# Patient Record
Sex: Male | Born: 1950 | Race: White | Hispanic: No | Marital: Married
Health system: Midwestern US, Academic
[De-identification: ages and names within clinical notes are randomized; demographics above are authoritative.]

---

## 2017-07-30 LAB — COMPREHENSIVE METABOLIC PANEL
Lab: 0.4
Lab: 1
Lab: 104
Lab: 107 — ABNORMAL HIGH (ref 65–99)
Lab: 139 — ABNORMAL HIGH (ref <150)
Lab: 18
Lab: 27
Lab: 3.9
Lab: 34
Lab: 48 — ABNORMAL HIGH (ref 9–46)
Lab: 6.7
Lab: 70
Lab: 76
Lab: 81
Lab: 9.2

## 2017-07-30 LAB — PROSTATIC SPECIFIC ANTIGEN-PSA: Lab: 1.3 — ABNORMAL LOW (ref >40)

## 2017-07-30 LAB — LIPID PROFILE: Lab: 102

## 2017-09-22 ENCOUNTER — Encounter: Admit: 2017-09-22 | Discharge: 2017-09-22 | Payer: MEDICARE

## 2017-09-22 NOTE — Telephone Encounter
Checked with pt. He has not attempted to send a picture yet. It might be later tonight. Offered him a possible appt time 12/6 if needed. He might be in the area on Monday and will stop by then if needed. Will have picture reviewed when it arrives and advise pt.

## 2017-09-22 NOTE — Telephone Encounter
Pt called to report a reddened, spot about 1 1/2" to the right of sternal incision. Feels like tip of a pacer wire is sticking out; another wire was pulled out in that same area. It began about three months ago and now is a raised, reddened area. Asked pt to send picture to [facility] Email for evaluation.

## 2017-09-23 ENCOUNTER — Ambulatory Visit: Admit: 2017-09-23 | Discharge: 2017-09-24 | Payer: 59

## 2017-09-24 DIAGNOSIS — Z950 Presence of cardiac pacemaker: Principal | ICD-10-CM

## 2017-12-17 ENCOUNTER — Encounter: Admit: 2017-12-17 | Discharge: 2017-12-17 | Payer: MEDICARE

## 2018-01-20 ENCOUNTER — Encounter: Admit: 2018-01-20 | Discharge: 2018-01-20 | Payer: MEDICARE

## 2018-01-20 DIAGNOSIS — R9439 Abnormal result of other cardiovascular function study: ICD-10-CM

## 2018-01-20 DIAGNOSIS — E039 Hypothyroidism, unspecified: ICD-10-CM

## 2018-01-20 DIAGNOSIS — E669 Obesity, unspecified: ICD-10-CM

## 2018-01-20 DIAGNOSIS — K219 Gastro-esophageal reflux disease without esophagitis: ICD-10-CM

## 2018-01-20 DIAGNOSIS — E785 Hyperlipidemia, unspecified: Principal | ICD-10-CM

## 2018-01-20 DIAGNOSIS — M109 Gout, unspecified: ICD-10-CM

## 2018-01-20 DIAGNOSIS — R079 Chest pain, unspecified: ICD-10-CM

## 2018-01-20 DIAGNOSIS — I2510 Atherosclerotic heart disease of native coronary artery without angina pectoris: ICD-10-CM

## 2018-03-15 ENCOUNTER — Ambulatory Visit: Admit: 2018-03-15 | Discharge: 2018-03-16 | Payer: 59

## 2018-03-15 ENCOUNTER — Encounter: Admit: 2018-03-15 | Discharge: 2018-03-15 | Payer: MEDICARE

## 2018-03-15 DIAGNOSIS — I10 Essential (primary) hypertension: Principal | ICD-10-CM

## 2018-03-15 DIAGNOSIS — R9439 Abnormal result of other cardiovascular function study: ICD-10-CM

## 2018-03-15 DIAGNOSIS — I2510 Atherosclerotic heart disease of native coronary artery without angina pectoris: ICD-10-CM

## 2018-03-15 DIAGNOSIS — M109 Gout, unspecified: ICD-10-CM

## 2018-03-15 DIAGNOSIS — E782 Mixed hyperlipidemia: ICD-10-CM

## 2018-03-15 DIAGNOSIS — K219 Gastro-esophageal reflux disease without esophagitis: ICD-10-CM

## 2018-03-15 DIAGNOSIS — E669 Obesity, unspecified: ICD-10-CM

## 2018-03-15 DIAGNOSIS — Z951 Presence of aortocoronary bypass graft: ICD-10-CM

## 2018-03-15 DIAGNOSIS — E785 Hyperlipidemia, unspecified: Principal | ICD-10-CM

## 2018-03-15 DIAGNOSIS — R079 Chest pain, unspecified: ICD-10-CM

## 2018-03-15 DIAGNOSIS — E039 Hypothyroidism, unspecified: ICD-10-CM

## 2018-03-17 ENCOUNTER — Encounter: Admit: 2018-03-17 | Discharge: 2018-03-17 | Payer: MEDICARE

## 2018-05-20 ENCOUNTER — Encounter: Admit: 2018-05-20 | Discharge: 2018-05-20 | Payer: MEDICARE

## 2018-05-24 ENCOUNTER — Ambulatory Visit: Admit: 2018-05-24 | Discharge: 2018-05-25 | Payer: 59

## 2018-05-24 ENCOUNTER — Encounter: Admit: 2018-05-24 | Discharge: 2018-05-24 | Payer: MEDICARE

## 2018-05-24 DIAGNOSIS — K219 Gastro-esophageal reflux disease without esophagitis: ICD-10-CM

## 2018-05-24 DIAGNOSIS — E785 Hyperlipidemia, unspecified: Principal | ICD-10-CM

## 2018-05-24 DIAGNOSIS — E669 Obesity, unspecified: ICD-10-CM

## 2018-05-24 DIAGNOSIS — E782 Mixed hyperlipidemia: ICD-10-CM

## 2018-05-24 DIAGNOSIS — M109 Gout, unspecified: ICD-10-CM

## 2018-05-24 DIAGNOSIS — I2510 Atherosclerotic heart disease of native coronary artery without angina pectoris: Principal | ICD-10-CM

## 2018-05-24 DIAGNOSIS — E039 Hypothyroidism, unspecified: ICD-10-CM

## 2018-05-24 DIAGNOSIS — R079 Chest pain, unspecified: ICD-10-CM

## 2018-05-24 DIAGNOSIS — R9439 Abnormal result of other cardiovascular function study: ICD-10-CM

## 2018-05-24 DIAGNOSIS — I10 Essential (primary) hypertension: Secondary | ICD-10-CM

## 2018-05-24 DIAGNOSIS — Z951 Presence of aortocoronary bypass graft: ICD-10-CM

## 2018-05-25 ENCOUNTER — Encounter: Admit: 2018-05-25 | Discharge: 2018-05-25 | Payer: MEDICARE

## 2018-05-25 DIAGNOSIS — I208 Other forms of angina pectoris: Principal | ICD-10-CM

## 2018-05-25 DIAGNOSIS — I25118 Atherosclerotic heart disease of native coronary artery with other forms of angina pectoris: ICD-10-CM

## 2018-05-25 DIAGNOSIS — R9439 Abnormal result of other cardiovascular function study: ICD-10-CM

## 2018-05-25 DIAGNOSIS — I10 Essential (primary) hypertension: ICD-10-CM

## 2018-05-25 DIAGNOSIS — Z8249 Family history of ischemic heart disease and other diseases of the circulatory system: ICD-10-CM

## 2018-05-25 MED ORDER — LOSARTAN 100 MG PO TAB
100 mg | ORAL_TABLET | Freq: Every day | ORAL | 0 refills | 30.00000 days | Status: AC
Start: 2018-05-25 — End: 2018-08-19

## 2018-08-19 ENCOUNTER — Encounter: Admit: 2018-08-19 | Discharge: 2018-08-19 | Payer: MEDICARE

## 2018-08-19 DIAGNOSIS — I2510 Atherosclerotic heart disease of native coronary artery without angina pectoris: Principal | ICD-10-CM

## 2018-08-19 MED ORDER — LOSARTAN 100 MG PO TAB
ORAL_TABLET | Freq: Every day | ORAL | 0 refills | 30.00000 days | Status: AC
Start: 2018-08-19 — End: 2018-11-11

## 2018-09-03 LAB — THYROID STIMULATING HORMONE-TSH: Lab: 2.4

## 2018-09-03 LAB — BASIC METABOLIC PANEL: Lab: 137

## 2018-09-09 ENCOUNTER — Encounter: Admit: 2018-09-09 | Discharge: 2018-09-09 | Payer: MEDICARE

## 2018-10-06 ENCOUNTER — Encounter: Admit: 2018-10-06 | Discharge: 2018-10-06 | Payer: MEDICARE

## 2018-10-06 ENCOUNTER — Ambulatory Visit: Admit: 2018-10-06 | Discharge: 2018-10-06 | Payer: 59

## 2018-10-06 DIAGNOSIS — E669 Obesity, unspecified: ICD-10-CM

## 2018-10-06 DIAGNOSIS — R079 Chest pain, unspecified: ICD-10-CM

## 2018-10-06 DIAGNOSIS — R9439 Abnormal result of other cardiovascular function study: ICD-10-CM

## 2018-10-06 DIAGNOSIS — K219 Gastro-esophageal reflux disease without esophagitis: ICD-10-CM

## 2018-10-06 DIAGNOSIS — I2510 Atherosclerotic heart disease of native coronary artery without angina pectoris: ICD-10-CM

## 2018-10-06 DIAGNOSIS — E785 Hyperlipidemia, unspecified: Principal | ICD-10-CM

## 2018-10-06 DIAGNOSIS — E782 Mixed hyperlipidemia: Principal | ICD-10-CM

## 2018-10-06 DIAGNOSIS — M109 Gout, unspecified: ICD-10-CM

## 2018-10-06 DIAGNOSIS — E039 Hypothyroidism, unspecified: ICD-10-CM

## 2018-10-06 MED ORDER — AMLODIPINE 5 MG PO TAB
5 mg | ORAL_TABLET | Freq: Every day | ORAL | 6 refills | Status: AC
Start: 2018-10-06 — End: 2018-10-06

## 2018-10-06 MED ORDER — AMLODIPINE 5 MG PO TAB
5 mg | ORAL_TABLET | Freq: Every day | ORAL | 1 refills | Status: AC
Start: 2018-10-06 — End: 2019-03-28

## 2018-10-10 ENCOUNTER — Ambulatory Visit: Admit: 2018-10-10 | Discharge: 2018-10-11 | Payer: 59

## 2018-10-10 DIAGNOSIS — E782 Mixed hyperlipidemia: Principal | ICD-10-CM

## 2018-10-10 DIAGNOSIS — I2510 Atherosclerotic heart disease of native coronary artery without angina pectoris: ICD-10-CM

## 2018-10-10 MED ORDER — NITROGLYCERIN 0.4 MG SL SUBL
.4 mg | SUBLINGUAL | 0 refills | Status: AC | PRN
Start: 2018-10-10 — End: ?

## 2018-10-10 MED ORDER — AMINOPHYLLINE 500 MG/20 ML IV SOLN
50 mg | INTRAVENOUS | 0 refills | Status: AC | PRN
Start: 2018-10-10 — End: ?

## 2018-10-10 MED ORDER — SODIUM CHLORIDE 0.9 % IV SOLP
250 mL | INTRAVENOUS | 0 refills | Status: AC | PRN
Start: 2018-10-10 — End: ?

## 2018-10-10 MED ORDER — BENZOCAINE-MENTHOL 6-10 MG MM LOZG
1 | Freq: Once | ORAL | 0 refills | Status: AC | PRN
Start: 2018-10-10 — End: ?

## 2018-10-10 MED ORDER — ALBUTEROL SULFATE 90 MCG/ACTUATION IN HFAA
2 | RESPIRATORY_TRACT | 0 refills | Status: DC | PRN
Start: 2018-10-10 — End: 2018-10-15

## 2018-10-10 MED ORDER — REGADENOSON 0.4 MG/5 ML IV SYRG
.4 mg | Freq: Once | INTRAVENOUS | 0 refills | Status: CP
Start: 2018-10-10 — End: ?

## 2018-10-13 ENCOUNTER — Encounter: Admit: 2018-10-13 | Discharge: 2018-10-13 | Payer: MEDICARE

## 2018-10-14 ENCOUNTER — Encounter: Admit: 2018-10-14 | Discharge: 2018-10-14 | Payer: MEDICARE

## 2018-11-11 ENCOUNTER — Encounter: Admit: 2018-11-11 | Discharge: 2018-11-11 | Payer: MEDICARE

## 2018-11-11 MED ORDER — LOSARTAN 100 MG PO TAB
ORAL_TABLET | Freq: Every day | ORAL | 3 refills | 30.00000 days | Status: AC
Start: 2018-11-11 — End: 2019-02-21

## 2018-11-11 MED ORDER — LOSARTAN 100 MG PO TAB
100 mg | ORAL_TABLET | Freq: Every day | ORAL | 3 refills | 90.00000 days | Status: AC
Start: 2018-11-11 — End: 2019-02-21

## 2018-11-11 MED ORDER — LOSARTAN 100 MG PO TAB
100 mg | ORAL_TABLET | Freq: Every day | ORAL | 3 refills | 30.00000 days | Status: AC
Start: 2018-11-11 — End: 2018-11-11

## 2019-02-20 ENCOUNTER — Encounter: Admit: 2019-02-20 | Discharge: 2019-02-20 | Payer: MEDICARE

## 2019-02-21 MED ORDER — LOSARTAN 100 MG PO TAB
100 mg | ORAL_TABLET | Freq: Every day | ORAL | 1 refills | 30.00000 days | Status: DC
Start: 2019-02-21 — End: 2019-02-23

## 2019-02-21 MED ORDER — VALSARTAN 320 MG PO TAB
ORAL_TABLET | 0 refills
Start: 2019-02-21 — End: ?

## 2019-02-22 ENCOUNTER — Encounter: Admit: 2019-02-22 | Discharge: 2019-02-22 | Payer: MEDICARE

## 2019-02-23 MED ORDER — LOSARTAN 100 MG PO TAB
ORAL_TABLET | Freq: Every day | ORAL | 3 refills | 90.00000 days | Status: DC
Start: 2019-02-23 — End: 2020-04-02

## 2019-03-28 ENCOUNTER — Ambulatory Visit: Admit: 2019-03-28 | Discharge: 2019-03-29

## 2019-03-28 ENCOUNTER — Encounter: Admit: 2019-03-28 | Discharge: 2019-03-28

## 2019-03-28 DIAGNOSIS — M109 Gout, unspecified: Secondary | ICD-10-CM

## 2019-03-28 DIAGNOSIS — I10 Essential (primary) hypertension: Secondary | ICD-10-CM

## 2019-03-28 DIAGNOSIS — K219 Gastro-esophageal reflux disease without esophagitis: Secondary | ICD-10-CM

## 2019-03-28 DIAGNOSIS — E039 Hypothyroidism, unspecified: Secondary | ICD-10-CM

## 2019-03-28 DIAGNOSIS — E785 Hyperlipidemia, unspecified: Secondary | ICD-10-CM

## 2019-03-28 DIAGNOSIS — I2510 Atherosclerotic heart disease of native coronary artery without angina pectoris: Secondary | ICD-10-CM

## 2019-03-28 DIAGNOSIS — E669 Obesity, unspecified: Secondary | ICD-10-CM

## 2019-03-28 DIAGNOSIS — E7800 Pure hypercholesterolemia, unspecified: Secondary | ICD-10-CM

## 2019-03-28 DIAGNOSIS — R9439 Abnormal result of other cardiovascular function study: Secondary | ICD-10-CM

## 2019-03-28 DIAGNOSIS — R079 Chest pain, unspecified: Secondary | ICD-10-CM

## 2019-03-28 MED ORDER — AMLODIPINE 5 MG PO TAB
5 mg | ORAL_TABLET | ORAL | 3 refills | Status: DC
Start: 2019-03-28 — End: 2019-12-07

## 2019-03-28 MED ORDER — AMLODIPINE 2.5 MG PO TAB
2.5 mg | ORAL_TABLET | Freq: Every evening | ORAL | 3 refills | Status: CN
Start: 2019-03-28 — End: ?

## 2019-03-28 NOTE — Progress Notes
Date of Service: 03/28/2019    Billy May is a 68 y.o. male.       HPI     Billy May is followed for coronary artery disease, hypertension and hypercholesterolemia.  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  On November 27, 2016 he underwent coronary artery bypass times 4 (left internal mammary artery anastomosed to the LAD, reverse saphenous vein graft anastomosed in sequence to the high diagonal and circumflex marginal artery and reverse saphenous vein graft anastomosed to the posterior descending artery).  He believes that his blood pressure tends to run approximately 140/80 mmHg.  Otherwise, The patient has been doing well and reports no angina, congestive symptoms, palpitations, sensation of sustained forceful heart pounding, lightheadedness or syncope.  His exercise tolerance has been stable.  He is active with farming but does not have a regular exercise program.  The patient reports no myalgias, bleeding abnormalities, neurologic motor abnormalities or difficulty with speech.        Vitals:    03/28/19 0833 03/28/19 0857   BP: (!) 140/84 134/78   BP Source: Arm, Left Upper Arm, Right Upper   Pulse: 56    SpO2: 98%    Weight: 91.2 kg (201 lb)    Height: 1.702 m (5' 7)    PainSc: Zero      Body mass index is 31.48 kg/m???.     Past Medical History  Patient Active Problem List    Diagnosis Date Noted   ??? 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 Acute blood loss anemia 11/27/2016   ??? Chronic diastolic CHF (congestive heart failure), NYHA class 2 (HCC) 11/26/2016   ??? Abnormal cardiovascular stress test    ??? Obesity, Class I, BMI 30-34.9    ??? GERD (gastroesophageal reflux disease)    ??? Coronary artery disease involving native coronary artery of native heart without angina pectoris 11/24/2016     Added automatically from request for surgery [id number]     ??? CAD (coronary artery disease) 11/19/2016     11/19/16 Cardiac Catheterization by Dr. Steward Ros  ANGIOGRAPHY: 1. The left main arises normally from the left coronary cusp and bifurcates into the LAD and the left circumflex artery.  The left main has about a 30% stenosis in its distal segment.    2. The LAD arises normally from the left main coronary artery.  The LAD is a type 3 vessel.  The LAD has diffuse 40% to 50% disease in its proximal, as well as in its mid section, and it is heavily calcified in the same area.  The LAD also has an 80% stenosis in its mid segment.  Distal to this, the LAD has about 20% to 30% stenosis in the mid section after the second diagonal branch.  The LAD gives off 3 diagonal branches.  The first of the 3 diagonal branches has about 50% to 60% stenosis in its proximal segment.     3. The circumflex artery arises normally from the left main coronary artery.  The left circumflex artery has 4-5 tandem lesions in its proximal to mid section, which are all varying from 70% to 90% stenosis.  It gives off an OM branch that does not appear to have any angiographic evidence of significant stenosis.  4. The RCA is a dominant vessel that arises normally from the right coronary cusp.  The RCA has about 50% stenosis in its ostial segment.  The mid  to distal RCA is heavily calcified with diffuse disease of about 30% to 40%.  The PDA branch appears to have about 90% stenosis in its to distal segment.  The PLV branch is a small branch that does not appear to have any angiographic evidence of stenosis.  CONCLUSION:  Multivessel coronary artery disease as described above.     ??? Abnormal thallium stress test 11/05/2016   ??? Essential hypertension 09/22/2016   ??? Family history of coronary artery disease 09/22/2016   ??? Hyperlipemia 09/08/2016   ??? Observation for suspected cardiovascular disease 09/08/2016     01/28/2014  Exercise Stress exam   Conclusion:  No exercises induced chest pain, no ECG evidence of ischemia. PVC seen.  Hypertensive response to exercise  Medical thearpy with lifestyle modification. Low risk study.     ??? Chest pain 09/08/2016         Review of Systems   Constitution: Negative.   HENT: Positive for tinnitus.    Eyes: Negative.    Cardiovascular: Negative.    Respiratory: Negative.    Endocrine: Negative.    Hematologic/Lymphatic: Negative.    Skin: Negative.    Musculoskeletal: Positive for back pain, gout and joint pain.   Gastrointestinal: Negative.    Genitourinary: Negative.    Neurological: Negative.    Psychiatric/Behavioral: Negative.    Allergic/Immunologic: Negative.        Physical Exam   GENERAL: The patient is well developed, well nourished, resting comfortably and in no distress.   HEENT: No abnormalities of the visible oro-nasopharynx, conjunctiva or sclera are noted.  NECK: There is no jugular venous distension. Carotids are palpable and without bruits. There is no thyroid enlargement.  Chest: Lung fields are clear to auscultation. There are no wheezes or crackles.  His sternal incision is well-healed without any evidence for infection.  CV: There is a regular rhythm. The first and second heart sounds are normal. There are no murmurs, gallops or rubs.  His apical heart rate is 60 bpm.  ABD: The abdomen is soft and supple with normal bowel sounds. There is no hepatosplenomegaly, ascites, tenderness, masses or bruits.  Neuro: There are no focal motor defects. Ambulation is normal. Cognitive function appears normal.  Ext: There is no edema or evidence of deep vein thrombosis. Peripheral pulses are satisfactory.    SKIN: There are no rashes and no cellulitis  PSYCH: The patient is calm, rationale and oriented.    Cardiovascular Studies  A twelve-lead ECG obtained on October 06, 2018 revealed normal sinus rhythm with a heart rate of 57 bpm.    Labs obtained on 09/02/2018 revealed serum potassium 4.7 mmol/L and serum creatinine 1.26 mg/dL.  His total cholesterol was 104, triglycerides 119, HDL 30 and LDL cholesterol 53 mg/dL.    Regadenoson thallium stress test 10/10/2018: Scintigraphic (planar/tomographic): TID Ratio:  1.12  (normal <1.36).  Summed Stress Score:  1   , Summed Rest Score:  0. Tomography shows viability in all myocardial segments with equivocal reversible changes in a small area of the inferolateral wall.  Quantitative polar maps show the same area in question is a small reversible inferolateral apical defect equivocal. Regional Wall Thickening and Motion Post Stress: Abnormal septal motion and inferolateral hypokinesis are present. Left Ventricular Ejection Fraction (post stress, in the resting state) =??? 53 %.  Left Ventricular End Diastolic Volume: 606 mL. SUMMARY/OPINION:?????? This study is minimally abnormal.  There is a subtle but statistically insignificant area of reversible ischemia in the inferolateral apex.  Global left ventricular function is within normal limits other high risk indicators are not noted. This exam is compared to the patient's previous myocardial perfusion study dated October 14, 2016. The previous study was performed on a Bruce protocol to 92% max heart rate without chest pain and with occasional PVCs and a Duke treadmill score of 8 with a calculated ejection fraction of 55% and an end-diastolic of 73 mL. The previous study was interpreted to be mildly abnormal in the inferolateral wall. ???  Comparing the 2 studies qualitatively the perfusion patterns are similar with mild ischemia located in the same inferolateral distribution.  The defect is slightly less intense on the current exam but it should be noted that this exam was performed with regadenoson pharmacological stress, the previous with maximal exercise Bruce treadmill testing.  Thus, the 2 studies may not be precisely comparable quantitatively. In aggregate the study is low risk in regards to predicted annual cardiovascular mortality rate.    Echo Doppler to 10/2016:  ??? Normal left ventricular systolic function, estimated ejection fraction is 65%. ??? Grade I (mild) left ventricular diastolic dysfunction.  ??? The right ventricle is normal size and function. Normal TAPSE, it ranged between 2.91 cm -- 3.01 cm.  ??? Sclerotic aortic valve without stenosis, no regurgitation.  ??? No other significant valvular abnormalities.  ??? The pulmonary artery pressure could not be obtained.    Problems Addressed Today  Coronary artery disease.  Hypertension.  Hypercholesterolemia.  Assessment and Plan     Treatment alternatives for hypertension were reviewed with the patient and he wanted to increase his amlodipine from 5 mg daily to 5 mg in the morning and 2.5 mg in the evening. I have asked the patient to keep a log book of his BP readings and to report systolic BP readings exceeding 130 mm Hg.  Alternatives for hypercholesterolemia were also reviewed with the patient but he did not want to change his current dose of atorvastatin at this time. Regular mild aerobic exercise, weight loss and adherence to a heart healthy diet were recommended. I have asked him to return for follow-up in 6 months.         Current Medications (including today's revisions)  ??? acetaminophen (TYLENOL) 500 mg tablet Take 500 mg by mouth as Needed for Pain. Max of 4,000 mg of acetaminophen in 24 hours.   ??? allopurinol (ZYLOPRIM) 300 mg tablet Take 300 mg by mouth daily. Take with food.   ??? amLODIPine (NORVASC) 5 mg tablet Take one tablet by mouth as directed. 1 tablet in the morning 1/2 tablet in the evening   ??? aspirin EC 81 mg tablet Take 81 mg by mouth at bedtime daily. Take with food.    ??? atorvastatin (LIPITOR) 20 mg tablet Take 1 tablet by mouth daily. (Patient taking differently: Take 20 mg by mouth at bedtime daily.)   ??? gabapentin (NEURONTIN) 300 mg capsule Take 300 mg by mouth daily.   ??? levothyroxine (SYNTHROID) 50 mcg tablet Take 50 mcg by mouth daily 30 minutes before breakfast.   ??? losartan (COZAAR) 100 mg tablet TAKE 1 TABLET BY MOUTH EVERY DAY ??? meloxicam (MOBIC) 15 mg tablet Take 15 mg by mouth daily.   ??? metoprolol tartrate (LOPRESSOR) 25 mg tablet Take 0.5 tablets by mouth twice daily.   ??? naproxen sodium (ALEVE) 220 mg capsule Take 1 tablet by mouth as Needed. Take with food.    ??? pantoprazole DR (PROTONIX) 40 mg tablet Take 40 mg by mouth  daily.

## 2019-12-05 ENCOUNTER — Encounter: Admit: 2019-12-05 | Discharge: 2019-12-05 | Payer: MEDICARE

## 2019-12-07 ENCOUNTER — Encounter: Admit: 2019-12-07 | Discharge: 2019-12-07 | Payer: 59

## 2019-12-07 DIAGNOSIS — E039 Hypothyroidism, unspecified: Secondary | ICD-10-CM

## 2019-12-07 DIAGNOSIS — R9439 Abnormal result of other cardiovascular function study: Secondary | ICD-10-CM

## 2019-12-07 DIAGNOSIS — M109 Gout, unspecified: Secondary | ICD-10-CM

## 2019-12-07 DIAGNOSIS — E669 Obesity, unspecified: Secondary | ICD-10-CM

## 2019-12-07 DIAGNOSIS — E785 Hyperlipidemia, unspecified: Secondary | ICD-10-CM

## 2019-12-07 DIAGNOSIS — I10 Essential (primary) hypertension: Secondary | ICD-10-CM

## 2019-12-07 DIAGNOSIS — I2510 Atherosclerotic heart disease of native coronary artery without angina pectoris: Secondary | ICD-10-CM

## 2019-12-07 DIAGNOSIS — R079 Chest pain, unspecified: Secondary | ICD-10-CM

## 2019-12-07 DIAGNOSIS — K219 Gastro-esophageal reflux disease without esophagitis: Secondary | ICD-10-CM

## 2019-12-07 MED ORDER — AMLODIPINE 5 MG PO TAB
5 mg | ORAL_TABLET | Freq: Two times a day (BID) | ORAL | 3 refills | Status: AC
Start: 2019-12-07 — End: ?

## 2020-04-02 ENCOUNTER — Encounter: Admit: 2020-04-02 | Discharge: 2020-04-02 | Payer: 59

## 2020-04-02 MED ORDER — LOSARTAN 100 MG PO TAB
ORAL_TABLET | Freq: Every day | ORAL | 2 refills | 30.00000 days | Status: AC
Start: 2020-04-02 — End: ?

## 2020-04-02 NOTE — Telephone Encounter
Received a request via computer from the patients pharmacy requesting a refill.  Script e-scribed as requested.

## 2020-07-18 ENCOUNTER — Encounter: Admit: 2020-07-18 | Discharge: 2020-07-18 | Payer: 59

## 2020-07-18 DIAGNOSIS — M109 Gout, unspecified: Secondary | ICD-10-CM

## 2020-07-18 DIAGNOSIS — R9439 Abnormal result of other cardiovascular function study: Secondary | ICD-10-CM

## 2020-07-18 DIAGNOSIS — I2510 Atherosclerotic heart disease of native coronary artery without angina pectoris: Secondary | ICD-10-CM

## 2020-07-18 DIAGNOSIS — E669 Obesity, unspecified: Secondary | ICD-10-CM

## 2020-07-18 DIAGNOSIS — E039 Hypothyroidism, unspecified: Secondary | ICD-10-CM

## 2020-07-18 DIAGNOSIS — E782 Mixed hyperlipidemia: Secondary | ICD-10-CM

## 2020-07-18 DIAGNOSIS — E785 Hyperlipidemia, unspecified: Secondary | ICD-10-CM

## 2020-07-18 DIAGNOSIS — R079 Chest pain, unspecified: Secondary | ICD-10-CM

## 2020-07-18 DIAGNOSIS — I10 Essential (primary) hypertension: Secondary | ICD-10-CM

## 2020-07-18 DIAGNOSIS — Z951 Presence of aortocoronary bypass graft: Secondary | ICD-10-CM

## 2020-07-18 DIAGNOSIS — K219 Gastro-esophageal reflux disease without esophagitis: Secondary | ICD-10-CM

## 2020-07-18 NOTE — Progress Notes
Date of Service: 07/18/2020    Billy May is a 69 y.o. male.       HPI     Mckinnley and his wife were in the [facility] today for follow-up regarding coronary disease.  In 2018 his younger brother had a heart attack and family members were advised to be screened for coronary disease.    Nasier had absolutely no symptoms but his stress test was markedly abnormal and coronary arteriography demonstrated surgical disease.    He cannot see that he feels any differently after the bypass operation.  He remains free of any exertional chest discomfort or breathlessness.  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    Aside from family history he has been treated for hypertension and dyslipidemia.  He does not have diabetes nor does he use tobacco.         Vitals:    07/18/20 1546   BP: 132/76   BP Source: Arm, Left Upper   Patient Position: Sitting   Pulse: 58   SpO2: 98%   Weight: 94.5 kg (208 lb 6.4 oz)   Height: 1.702 m (5' 7)   PainSc: Zero     Body mass index is 32.64 kg/m?Marland Kitchen     Past Medical History  Patient Active Problem List    Diagnosis Date Noted   ? 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 Acute blood loss anemia 11/27/2016   ? Chronic diastolic CHF (congestive heart failure), NYHA class 2 (HCC) 11/26/2016   ? Abnormal cardiovascular stress test    ? Obesity, Class I, BMI 30-34.9    ? GERD (gastroesophageal reflux disease)    ? Coronary artery disease involving native coronary artery of native heart without angina pectoris 11/24/2016     Added automatically from request for surgery [id number]     ? CAD (coronary artery disease) 11/19/2016     11/19/16 Cardiac Catheterization by Dr. Steward Ros  ANGIOGRAPHY:    1. The left main arises normally from the left coronary cusp and bifurcates into the LAD and the left circumflex artery.  The left main has about a 30% stenosis in its distal segment.    2. The LAD arises normally from the left main coronary artery.  The LAD is a type 3 vessel.  The LAD has diffuse 40% to 50% disease in its proximal, as well as in its mid section, and it is heavily calcified in the same area.  The LAD also has an 80% stenosis in its mid segment.  Distal to this, the LAD has about 20% to 30% stenosis in the mid section after the second diagonal branch.  The LAD gives off 3 diagonal branches.  The first of the 3 diagonal branches has about 50% to 60% stenosis in its proximal segment.     3. The circumflex artery arises normally from the left main coronary artery.  The left circumflex artery has 4-5 tandem lesions in its proximal to mid section, which are all varying from 70% to 90% stenosis.  It gives off an OM branch that does not appear to have any angiographic evidence of significant stenosis.  4. The RCA is a dominant vessel that arises normally from the right coronary cusp.  The RCA has about 50% stenosis in its ostial segment.  The mid to distal RCA  is heavily calcified with diffuse disease of about 30% to 40%.  The PDA branch appears to have about 90% stenosis in its to distal segment.  The PLV branch is a small branch that does not appear to have any angiographic evidence of stenosis.  CONCLUSION:  Multivessel coronary artery disease as described above.     ? Abnormal thallium stress test 11/05/2016   ? Essential hypertension 09/22/2016   ? Family history of coronary artery disease 09/22/2016   ? 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 Observation for suspected cardiovascular disease 09/08/2016     01/28/2014  Exercise Stress exam   Conclusion:  No exercises induced chest pain, no ECG evidence of ischemia. PVC seen.  Hypertensive response to exercise  Medical thearpy with lifestyle modification.  Low risk study.     ? Chest pain 09/08/2016         Review of Systems   Constitutional: Negative.   HENT: Negative.    Eyes: Negative.    Cardiovascular: Negative.    Respiratory: Negative.    Endocrine: Negative.    Hematologic/Lymphatic: Negative.    Skin: Negative.    Musculoskeletal: Negative.    Gastrointestinal: Negative.    Genitourinary: Negative.    Neurological: Negative.    Psychiatric/Behavioral: Negative.    Allergic/Immunologic: Negative.        Physical Exam    Physical Exam   General Appearance: no distress   Skin: warm, no ulcers or xanthomas   Digits and Nails: no cyanosis or clubbing   Eyes: conjunctivae and lids normal, pupils are equal and round   Teeth/Gums/Palate: dentition unremarkable, no lesions   Lips & Oral Mucosa: no pallor or cyanosis   Neck Veins: normal JVP , neck veins are not distended   Thyroid: no nodules, masses, tenderness or enlargement   Chest Inspection: chest is normal in appearance   Respiratory Effort: breathing comfortably, no respiratory distress   Auscultation/Percussion: lungs clear to auscultation, no rales or rhonchi, no wheezing   PMI: PMI not enlarged or displaced   Cardiac Rhythm: regular rhythm and normal rate   Cardiac Auscultation: S1, S2 normal, no rub, no gallop   Murmurs: no murmur   Peripheral Circulation: normal peripheral circulation   Carotid Arteries: normal carotid upstroke bilaterally, no bruits   Radial Arteries: normal symmetric radial pulses   Abdominal Aorta: no abdominal aortic bruit   Pedal Pulses: normal symmetric pedal pulses   Lower Extremity Edema: no lower extremity edema   Abdominal Exam: soft, non-tender, no masses, bowel sounds normal   Liver & Spleen: no organomegaly   Gait & Station: walks without assistance   Muscle Strength: normal muscle tone   Orientation: oriented to time, place and person   Affect & Mood: appropriate and sustained affect   Language and Memory: patient responsive and seems to comprehend information   Neurologic Exam: neurological assessment grossly intact   Other: moves all extremities      Cardiovascular Health Factors  Vitals BP Readings from Last 3 Encounters:   07/18/20 132/76   12/07/19 134/74   03/28/19 134/78     Wt Readings from Last 3 Encounters:   07/18/20 94.5 kg (208 lb 6.4 oz)   12/07/19 93 kg (205 lb)   03/28/19 91.2 kg (201 lb)     BMI Readings from Last 3 Encounters:   07/18/20 32.64 kg/m?   12/07/19 32.11 kg/m?   03/28/19 31.48 kg/m?      Smoking Social History     Tobacco  Use   Smoking Status Never Smoker   Smokeless Tobacco Never Used      Lipid Profile Cholesterol   Date Value Ref Range Status   09/02/2018 104  Final     HDL   Date Value Ref Range Status   09/02/2018 30 (L)  Final     LDL   Date Value Ref Range Status   09/02/2018 53  Final     Triglycerides   Date Value Ref Range Status   09/02/2018 119  Final      Blood Sugar Hemoglobin A1C   Date Value Ref Range Status   11/19/2016 5.9 4.0 - 6.0 % Final     Comment:     The ADA recommends that most patients with type 1 and type 2 diabetes maintain   an A1c level <7%.       Glucose   Date Value Ref Range Status   10/09/2019 105  Final   09/02/2018 102  Final   07/30/2017 107 (H) 65 - 99 Final     Glucose, POC   Date Value Ref Range Status   12/02/2016 151 (H) 70 - 100 MG/DL Final   29/56/2130 865 (H) 70 - 100 MG/DL Final   78/46/9629 528 (H) 70 - 100 MG/DL Final          Problems Addressed Today  Encounter Diagnoses   Name Primary?   ? Coronary artery disease involving native coronary artery of native heart without angina pectoris Yes   ? Essential hypertension    ? S/P CABG x 4    ? Mixed dyslipidemia        Assessment and Plan       CAD (coronary artery disease)  His cardiac ischemia is entirely without symptoms and he will require surveillance stress testing every couple of years.  I have ordered a treadmill only stress test.    Essential hypertension  The goal for his blood pressure is 130/80 or less, on average.  He seems to be treated to goal according to occasional home blood pressure readings.    Mixed dyslipidemia  He has very low HDL levels but we have tried to counter this by pushing his LDL into the 50s.    Lab Results   Component Value Date    CHOL 104 09/02/2018    TRIG 119 09/02/2018    HDL 30 (L) 09/02/2018    LDL 53 09/02/2018    VLDL 33 11/19/2016    NONHDLCHOL 74 09/02/2018    CHOLHDLC 3.5 09/02/2018            Current Medications (including today's revisions)  ? acetaminophen (TYLENOL) 500 mg tablet Take 500 mg by mouth as Needed for Pain. Max of 4,000 mg of acetaminophen in 24 hours.   ? allopurinol (ZYLOPRIM) 300 mg tablet Take 300 mg by mouth daily. Take with food.   ? amLODIPine (NORVASC) 5 mg tablet Take one tablet by mouth twice daily. 1 tablet in the morning 1/2 tablet in the evening   ? aspirin EC 81 mg tablet Take 81 mg by mouth at bedtime daily. Take with food.    ? atorvastatin (LIPITOR) 20 mg tablet Take 1 tablet by mouth daily. (Patient taking differently: Take 20 mg by mouth at bedtime daily.)   ? gabapentin (NEURONTIN) 300 mg capsule Take 300 mg by mouth daily.   ? levothyroxine (SYNTHROID) 50 mcg tablet Take 50 mcg by mouth daily 30 minutes before breakfast.   ? losartan (  COZAAR) 100 mg tablet TAKE 1 TABLET BY MOUTH EVERY DAY   ? meloxicam (MOBIC) 15 mg tablet Take 15 mg by mouth daily.   ? metoprolol tartrate (LOPRESSOR) 25 mg tablet Take 0.5 tablets by mouth twice daily.   ? naproxen sodium (ALEVE) 220 mg capsule Take 1 tablet by mouth as Needed. Take with food.    ? pantoprazole DR (PROTONIX) 40 mg tablet Take 40 mg by mouth daily.     Total time spent on today's office visit was 30 minutes.  This includes face-to-face in person visit with patient as well as nonface-to-face time including review of the EMR, outside records, labs, radiologic studies, echocardiogram & other cardiovascular studies, formation of treatment plan, after visit summary, future disposition, and lastly on documentation.

## 2020-07-18 NOTE — Assessment & Plan Note
He has very low HDL levels but we have tried to counter this by pushing his LDL into the 50s.    Lab Results   Component Value Date    CHOL 104 09/02/2018    TRIG 119 09/02/2018    HDL 30 (L) 09/02/2018    LDL 53 09/02/2018    VLDL 33 11/19/2016    NONHDLCHOL 74 09/02/2018    CHOLHDLC 3.5 09/02/2018

## 2020-07-18 NOTE — Assessment & Plan Note
The goal for his blood pressure is 130/80 or less, on average.  He seems to be treated to goal according to occasional home blood pressure readings.

## 2020-07-18 NOTE — Assessment & Plan Note
His cardiac ischemia is entirely without symptoms and he will require surveillance stress testing every couple of years.  I have ordered a treadmill only stress test.

## 2020-10-10 ENCOUNTER — Encounter: Admit: 2020-10-10 | Discharge: 2020-10-10 | Payer: 59

## 2020-10-10 ENCOUNTER — Ambulatory Visit: Admit: 2020-10-10 | Discharge: 2020-10-10 | Payer: 59

## 2020-10-10 DIAGNOSIS — I25810 Atherosclerosis of coronary artery bypass graft(s) without angina pectoris: Secondary | ICD-10-CM

## 2021-01-25 ENCOUNTER — Encounter: Admit: 2021-01-25 | Discharge: 2021-01-25 | Payer: MEDICARE

## 2021-01-25 MED ORDER — LOSARTAN 100 MG PO TAB
ORAL_TABLET | Freq: Every day | 2 refills
Start: 2021-01-25 — End: ?

## 2021-01-25 MED ORDER — AMLODIPINE 5 MG PO TAB
ORAL_TABLET | 5 refills
Start: 2021-01-25 — End: ?

## 2021-02-20 ENCOUNTER — Encounter: Admit: 2021-02-20 | Discharge: 2021-02-20 | Payer: MEDICARE

## 2021-02-25 ENCOUNTER — Encounter: Admit: 2021-02-25 | Discharge: 2021-02-25 | Payer: MEDICARE

## 2021-02-25 DIAGNOSIS — I2510 Atherosclerotic heart disease of native coronary artery without angina pectoris: Secondary | ICD-10-CM

## 2021-02-25 DIAGNOSIS — E669 Obesity, unspecified: Secondary | ICD-10-CM

## 2021-02-25 DIAGNOSIS — E039 Hypothyroidism, unspecified: Secondary | ICD-10-CM

## 2021-02-25 DIAGNOSIS — K219 Gastro-esophageal reflux disease without esophagitis: Secondary | ICD-10-CM

## 2021-02-25 DIAGNOSIS — E785 Hyperlipidemia, unspecified: Secondary | ICD-10-CM

## 2021-02-25 DIAGNOSIS — M109 Gout, unspecified: Secondary | ICD-10-CM

## 2021-02-25 DIAGNOSIS — R079 Chest pain, unspecified: Secondary | ICD-10-CM

## 2021-02-25 DIAGNOSIS — R9439 Abnormal result of other cardiovascular function study: Secondary | ICD-10-CM

## 2021-02-25 NOTE — Progress Notes
Date of Service: 02/25/2021    SCHYLER MCKEEN is a 70 y.o. male.       HPI    Mr. Sandner is followed for coronary artery disease, hypertension and hypercholesterolemia. 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  He has also received his Covid vaccine.    He has had a good year without hospitalizations or emergency room visits.  Reports that his blood pressures been uniformly well controlled.  Otherwise, The patient has been doing well and reports no angina, congestive symptoms, palpitations, sensation of sustained forceful heart pounding, lightheadedness or syncope.??His exercise tolerance has been stable.??He is active with farming but does not have a regular exercise program. ?The patient reports no myalgias, bleeding abnormalities, or strokelike symptoms.    Historically, on November 27, 2016 he underwent coronary artery bypass times 4 (left internal mammary artery anastomosed to the LAD, reverse saphenous vein graft anastomosed in sequence to the high diagonal and circumflex marginal artery and reverse saphenous vein graft anastomosed to the posterior descending artery).??He had Covid in December 2020.  He describes this as a mild case with cough and low-grade temperature and some muscle aches.  He was seen by a physician who performed an x-ray and said that he had bronchitis without pneumonia.  He was treated with prednisone.       Vitals:    02/25/21 0908   BP: 132/70   BP Source: Arm, Right Upper   O2 Device: None (Room air)   PainSc: Zero   Weight: 93 kg (205 lb)   Height: 167.6 cm (5' 6)     Body mass index is 33.09 kg/m?Marland Kitchen     Past Medical History  Patient Active Problem List    Diagnosis Date Noted   ? 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 Acute blood loss anemia 11/27/2016   ? Chronic diastolic CHF (congestive heart failure), NYHA class 2 (HCC) 11/26/2016   ? Abnormal cardiovascular stress test    ? Obesity, Class I, BMI 30-34.9    ? GERD (gastroesophageal reflux disease)    ? Coronary artery disease involving native coronary artery of native heart without angina pectoris 11/24/2016     Added automatically from request for surgery [id number]     ? CAD (coronary artery disease) 11/19/2016     11/19/16 Cardiac Catheterization by Dr. Steward Ros  ANGIOGRAPHY:    1. The left main arises normally from the left coronary cusp and bifurcates into the LAD and the left circumflex artery.  The left main has about a 30% stenosis in its distal segment.    2. The LAD arises normally from the left main coronary artery.  The LAD is a type 3 vessel.  The LAD has diffuse 40% to 50% disease in its proximal, as well as in its mid section, and it is heavily calcified in the same area.  The LAD also has an 80% stenosis in its mid segment.  Distal to this, the LAD has about 20% to 30% stenosis in the mid section after the second diagonal branch.  The LAD gives off 3 diagonal branches.  The first of the 3 diagonal branches has about 50% to 60% stenosis in its proximal segment.     3. The circumflex artery arises normally from the left main coronary artery.  The left circumflex artery has 4-5 tandem lesions in its proximal to mid section, which are all varying from 70% to 90% stenosis.  It gives off an OM branch that does not appear to have any angiographic evidence of significant stenosis.  4. The RCA is a dominant vessel that arises normally from the right coronary cusp.  The RCA has about 50% stenosis in its ostial segment.  The mid to distal RCA is heavily calcified with diffuse disease of about 30% to 40%.  The PDA branch appears to have about 90% stenosis in its to distal segment.  The PLV branch is a small branch that does not appear to have any angiographic evidence of stenosis.  CONCLUSION:  Multivessel coronary artery disease as described above.     ? Abnormal thallium stress test 11/05/2016   ? Essential hypertension 09/22/2016   ? Family history of coronary artery disease 09/22/2016   ? 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 Observation for suspected cardiovascular disease 09/08/2016     01/28/2014  Exercise Stress exam   Conclusion:  No exercises induced chest pain, no ECG evidence of ischemia. PVC seen.  Hypertensive response to exercise  Medical thearpy with lifestyle modification.  Low risk study.     ? Chest pain 09/08/2016         Review of Systems   Constitutional: Negative.   HENT: Negative.    Eyes: Negative.    Cardiovascular: Negative.    Respiratory: Negative.    Endocrine: Negative.    Hematologic/Lymphatic: Negative.    Skin: Negative.    Musculoskeletal: Positive for arthritis and back pain.   Gastrointestinal: Negative.    Genitourinary: Negative.    Neurological: Negative.    Psychiatric/Behavioral: Negative.    Allergic/Immunologic: Negative.        Physical Exam  GENERAL: The patient is well developed, well nourished, resting comfortably and in no distress.   HEENT: No abnormalities of the visible oro-nasopharynx, conjunctiva or sclera are noted.  NECK: There is no jugular venous distension. Carotids are palpable and without bruits. There is no thyroid enlargement.  Chest: Lung fields are clear to auscultation. There are no wheezes or crackles.??His sternal incision is well-healed without any evidence for infection.  CV: There is a regular rhythm. The first and second heart sounds are normal.  A soft systolic ejection murmur is heard.  There are no diastolic murmurs, gallops or rubs.??His apical heart rate is 56 bpm.  ABD: The abdomen is soft and supple with normal bowel sounds. There is no hepatosplenomegaly, ascites, tenderness, masses or bruits.  Neuro: There are no focal motor defects. Ambulation is normal. Cognitive function appears normal.  Ext:?There is no edema or evidence of deep vein thrombosis. Peripheral pulses are satisfactory. ?  SKIN:?There are no rashes and no cellulitis  PSYCH:?The patient is calm, rationale and oriented.    Cardiovascular Studies  A twelve-lead ECG was obtained on 02/25/2021 and reveals normal sinus rhythm with a heart rate of 56 bpm.  There is no evidence of myocardial ischemia or infarction.    Treadmill stress test 10/10/2020:  Interpretation Summary    Bruce protocol treadmill stress test.    Exercise duration 8 minutes 59 seconds.   10.1 METs achieved.  Peak heart rate 146 bpm which was 97% of age-adjusted predicted maximal heart rate.  Blood pressure at peak exercise was 200 mmHg.  Pressure 5 29,000.  No chest pain with exercise  Oxygen saturation was 97% at rest that decreased to 89% at 4 minutes of exercise and was 84% at 7 minutes of exercise.  Rare PVCs with exercise.  Occasional PVCs in recovery.  No significant ST segment changes  ?  Good functional capacity.  Nonischemic clinical response.  Nonischemic EKG response.  Duke treadmill score +9-low risk.  Rare VPCs with exercise and occasional VPCs in recovery.  Oxygen saturation was 97% at rest that decreased to 89% at 4 minutes of exercise and was 84% at 7 minutes of exercise.    Regadenoson?thallium stress test 10/10/2018:  Scintigraphic (planar/tomographic):?TID Ratio: ?1.12 ?(normal <1.36). ?Summed Stress Score: ?1 ??, Summed Rest Score: ?0.?Tomography shows viability in all myocardial segments with equivocal reversible changes in a small area of the inferolateral wall. ?Quantitative polar maps show the same area in question is a small reversible inferolateral apical defect equivocal. Regional Wall Thickening and Motion Post Stress: Abnormal septal motion and inferolateral hypokinesis are present. Left Ventricular Ejection Fraction (post stress, in the resting state) =??53 %. ?Left Ventricular End Diastolic Volume: 191 mL.?SUMMARY/OPINION:???This study is minimally abnormal. ?There is a subtle but statistically insignificant area of reversible ischemia in the inferolateral apex. ?Global left ventricular function is within normal limits other high risk indicators are not noted. This exam is compared to the patient's previous myocardial perfusion study dated October 14, 2016. The previous study was performed on a Bruce protocol to 92% max heart rate without chest pain and with occasional PVCs and a Duke treadmill score of 8 with a calculated ejection fraction of 55% and an end-diastolic of 73 mL. The previous study was interpreted to be mildly abnormal in the inferolateral wall. ?  Comparing the 2 studies qualitatively the perfusion patterns are similar with mild ischemia located in the same inferolateral distribution. ?The defect is slightly less intense on the current exam but it should be noted that this exam was performed with regadenoson pharmacological stress, the previous with maximal exercise Bruce treadmill testing. ?Thus, the 2 studies may not be precisely comparable quantitatively. In aggregate the study is low risk in regards to predicted annual cardiovascular mortality rate.  ?  Echo?Doppler to 10/2016:  ? Normal left ventricular systolic function, estimated ejection fraction is 65%.  ? Grade I (mild) left ventricular diastolic dysfunction.  ? The right ventricle is normal size and function. Normal TAPSE, it ranged between 2.91 cm -- 3.01 cm.  ? Sclerotic aortic valve without stenosis, no regurgitation.  ? No other significant valvular abnormalities.  ? The pulmonary artery pressure could not be obtained.      Cardiovascular Health Factors  Vitals BP Readings from Last 3 Encounters:   02/25/21 132/70   07/18/20 132/76   12/07/19 134/74     Wt Readings from Last 3 Encounters:   02/25/21 93 kg (205 lb)   10/10/20 90.7 kg (200 lb)   07/18/20 94.5 kg (208 lb 6.4 oz)     BMI Readings from Last 3 Encounters:   02/25/21 33.09 kg/m?   10/10/20 32.28 kg/m?   07/18/20 32.64 kg/m?      Smoking Social History     Tobacco Use   Smoking Status Never Smoker   Smokeless Tobacco Never Used      Lipid Profile Cholesterol   Date Value Ref Range Status   09/16/2020 105  Final     HDL   Date Value Ref Range Status   09/16/2020 25 (L) >=40 Final     LDL   Date Value Ref Range Status   09/16/2020 55  Final     Triglycerides   Date Value Ref Range Status   09/16/2020 127  Final      Blood Sugar  Hemoglobin A1C   Date Value Ref Range Status   09/16/2020 5.6  Final     Glucose   Date Value Ref Range Status   09/16/2020 101  Final   10/09/2019 105  Final   08/08/2019 102 (H) 65 - 99 Final     Glucose, POC   Date Value Ref Range Status   12/02/2016 151 (H) 70 - 100 MG/DL Final   16/07/9603 540 (H) 70 - 100 MG/DL Final   98/08/9146 829 (H) 70 - 100 MG/DL Final          Problems Addressed Today  Encounter Diagnoses   Name Primary?   ? Coronary artery disease involving native coronary artery of native heart without angina pectoris Yes   Hypertension.  Hypercholesterolemia.    Assessment and Plan     Mr. Centrella reports that he is doing well without chest discomfort or congestive symptoms.  His LDL appears well controlled and his hypertension appears well controlled. Regular mild aerobic exercise, weight loss and adherence to a heart healthy diet were recommended.  I have asked the patient to keep a log book of his BP readings and to report BP readings exceeding 130/80 mm Hg.  I have asked him to return for follow-up in 1 years time.           Current Medications (including today's revisions)  ? acetaminophen (TYLENOL) 500 mg tablet Take 500 mg by mouth as Needed for Pain. Max of 4,000 mg of acetaminophen in 24 hours.   ? allopurinol (ZYLOPRIM) 300 mg tablet Take 300 mg by mouth daily. Take with food.   ? amLODIPine (NORVASC) 5 mg tablet TAKE 1 TABLET BY MOUTH IN THE MORNING AND 1/2 TABLET IN TH EVENING   ? aspirin EC 81 mg tablet Take 81 mg by mouth at bedtime daily. Take with food.    ? atorvastatin (LIPITOR) 20 mg tablet Take 1 tablet by mouth daily. (Patient taking differently: Take 20 mg by mouth at bedtime daily.)   ? gabapentin (NEURONTIN) 300 mg capsule Take 300 mg by mouth daily.   ? levothyroxine (SYNTHROID) 50 mcg tablet Take 50 mcg by mouth daily 30 minutes before breakfast.   ? losartan (COZAAR) 100 mg tablet TAKE 1 TABLET BY MOUTH EVERY DAY   ? meloxicam (MOBIC) 15 mg tablet Take 15 mg by mouth daily.   ? metoprolol tartrate (LOPRESSOR) 25 mg tablet Take 0.5 tablets by mouth twice daily. (Patient taking differently: Take 12.5 mg by mouth twice daily. 12.5mg  AM 25mg  PM)   ? naproxen sodium (ALEVE) 220 mg capsule Take 1 tablet by mouth as Needed. Take with food.    ? pantoprazole DR (PROTONIX) 40 mg tablet Take 40 mg by mouth daily.

## 2021-03-03 ENCOUNTER — Encounter: Admit: 2021-03-03 | Discharge: 2021-03-03 | Payer: MEDICARE

## 2021-03-03 DIAGNOSIS — R9439 Abnormal result of other cardiovascular function study: Secondary | ICD-10-CM

## 2021-03-03 DIAGNOSIS — I25118 Atherosclerotic heart disease of native coronary artery with other forms of angina pectoris: Secondary | ICD-10-CM

## 2021-03-03 DIAGNOSIS — Z8249 Family history of ischemic heart disease and other diseases of the circulatory system: Secondary | ICD-10-CM

## 2021-03-03 DIAGNOSIS — I208 Other forms of angina pectoris: Secondary | ICD-10-CM

## 2021-03-03 DIAGNOSIS — I10 Essential (primary) hypertension: Secondary | ICD-10-CM

## 2021-03-03 MED ORDER — METOPROLOL TARTRATE 25 MG PO TAB
ORAL_TABLET | ORAL | 3 refills | 90.00000 days | Status: AC
Start: 2021-03-03 — End: ?

## 2021-08-29 ENCOUNTER — Encounter: Admit: 2021-08-29 | Discharge: 2021-08-29 | Payer: MEDICARE

## 2021-09-15 ENCOUNTER — Encounter: Admit: 2021-09-15 | Discharge: 2021-09-15 | Payer: MEDICARE

## 2021-09-15 NOTE — Telephone Encounter
Billy May called and states that he has been holding his atorvastatin for 2 weeks as advised.  He reports that he has noticed an improvement in his muscle aches and cramps after stopping the medication.  Last LDL from 09/16/2020 was 55.    Will route to Dr. Arna Medici for recommendations.

## 2021-09-17 ENCOUNTER — Encounter: Admit: 2021-09-17 | Discharge: 2021-09-17 | Payer: MEDICARE

## 2021-10-30 ENCOUNTER — Encounter: Admit: 2021-10-30 | Discharge: 2021-10-30 | Payer: MEDICARE

## 2021-11-04 ENCOUNTER — Encounter: Admit: 2021-11-04 | Discharge: 2021-11-04 | Payer: MEDICARE

## 2021-11-04 ENCOUNTER — Ambulatory Visit: Admit: 2021-11-04 | Discharge: 2021-11-04 | Payer: MEDICARE

## 2021-11-04 DIAGNOSIS — K219 Gastro-esophageal reflux disease without esophagitis: Secondary | ICD-10-CM

## 2021-11-04 DIAGNOSIS — I208 Other forms of angina pectoris: Secondary | ICD-10-CM

## 2021-11-04 DIAGNOSIS — Z8249 Family history of ischemic heart disease and other diseases of the circulatory system: Secondary | ICD-10-CM

## 2021-11-04 DIAGNOSIS — I2510 Atherosclerotic heart disease of native coronary artery without angina pectoris: Secondary | ICD-10-CM

## 2021-11-04 DIAGNOSIS — R9439 Abnormal result of other cardiovascular function study: Secondary | ICD-10-CM

## 2021-11-04 DIAGNOSIS — R002 Palpitations: Secondary | ICD-10-CM

## 2021-11-04 DIAGNOSIS — R079 Chest pain, unspecified: Secondary | ICD-10-CM

## 2021-11-04 DIAGNOSIS — E039 Hypothyroidism, unspecified: Secondary | ICD-10-CM

## 2021-11-04 DIAGNOSIS — E669 Obesity, unspecified: Secondary | ICD-10-CM

## 2021-11-04 DIAGNOSIS — Z951 Presence of aortocoronary bypass graft: Secondary | ICD-10-CM

## 2021-11-04 DIAGNOSIS — I10 Essential (primary) hypertension: Secondary | ICD-10-CM

## 2021-11-04 DIAGNOSIS — E785 Hyperlipidemia, unspecified: Secondary | ICD-10-CM

## 2021-11-04 DIAGNOSIS — G4700 Insomnia, unspecified: Secondary | ICD-10-CM

## 2021-11-04 DIAGNOSIS — M109 Gout, unspecified: Secondary | ICD-10-CM

## 2021-11-04 DIAGNOSIS — I454 Nonspecific intraventricular block: Secondary | ICD-10-CM

## 2021-11-04 MED ORDER — NITROGLYCERIN 0.4 MG SL SUBL
.4 mg | ORAL_TABLET | SUBLINGUAL | 3 refills | 9.00000 days | Status: AC | PRN
Start: 2021-11-04 — End: ?

## 2021-11-04 MED ORDER — AMITRIPTYLINE 10 MG PO TAB
10 mg | ORAL_TABLET | Freq: Every evening | ORAL | 1 refills | Status: AC
Start: 2021-11-04 — End: ?

## 2021-11-04 NOTE — Progress Notes
Date of Service: 11/04/2021    Billy May is a 71 y.o. male.       HPI     Patient is a 71 year old Caucasian male past medical history of hypertension who was diagnosed with coronary artery disease and underwent four-vessel CABG in February 2018.  Patient was diagnosed coronary artery disease because of family history of early coronary artery disease.  States that his brother who is 10 years younger than him and recently underwent CABG and had his grandfather die in his 50s related to coronary artery disease Lurline Idol first-degree cousin who died in his 77s from sudden death.  Patient is never experienced true angina or any kind of chest pain.  No history of activity intolerance or change in overall wellbeing.  He has never been a smoker is nondiabetic.  Patient reports that since the start of the new year has been having this kind of flutter or buzzing sensation in his left chest just lateral to his nipple and touch lower.  States it comes and goes very quickly and screams when happening every day.  Does not really become lightheaded or short of breath.  Is not provoked by certain activities or positions.  Does not seem to associate with a particular time of day.  Did go into the emergency to apartment on the 11th of this month.  At that time ECG showed increased QRS complex duration.  Is always had a incomplete conduction abnormality there but now looks more like a true interventricular conduction defect.  No repolarization abnormalities or significant ectopy was otherwise detected.  Chest x-ray and lab work including BMP were normal.  Patient's had no fevers, chills, otherwise change to his health.  Has been on the rosuvastatin for a few months reports he is otherwise tolerating that medication.  Is otherwise compliant with his therapies.  He also reports he is a restless sleeper.  Had changed where he had worked nights previously.  Notes a lot of it is he just has a lot going through his mind and cannot really get his thoughts in a good place before he will fall asleep.         Vitals:    11/04/21 1110   BP: (!) 142/78   BP Source: Arm, Left Upper   Pulse: 57   SpO2: 98%   O2 Percent: 98 %   O2 Device: None (Room air)   PainSc: Zero   Weight: 92.8 kg (204 lb 9.6 oz)   Height: 167.6 cm (5' 6)     Body mass index is 33.02 kg/m?Marland Kitchen     Past Medical History  Patient Active Problem List    Diagnosis Date Noted   ? 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 Acute blood loss anemia 11/27/2016   ? Chronic diastolic CHF (congestive heart failure), NYHA class 2 (HCC) 11/26/2016   ? Abnormal cardiovascular stress test    ? Obesity, Class I, BMI 30-34.9    ? GERD (gastroesophageal reflux disease)    ? Coronary artery disease involving native coronary artery of native heart without angina pectoris 11/24/2016     Added automatically from request for surgery [id number]     ? CAD (coronary artery disease) 11/19/2016     11/19/16 Cardiac Catheterization by Dr. Steward Ros  ANGIOGRAPHY:    1. The left main arises normally from the left coronary cusp and bifurcates into the LAD and the left circumflex artery.  The left main  has about a 30% stenosis in its distal segment.    2. The LAD arises normally from the left main coronary artery.  The LAD is a type 3 vessel.  The LAD has diffuse 40% to 50% disease in its proximal, as well as in its mid section, and it is heavily calcified in the same area.  The LAD also has an 80% stenosis in its mid segment.  Distal to this, the LAD has about 20% to 30% stenosis in the mid section after the second diagonal branch.  The LAD gives off 3 diagonal branches.  The first of the 3 diagonal branches has about 50% to 60% stenosis in its proximal segment.     3. The circumflex artery arises normally from the left main coronary artery.  The left circumflex artery has 4-5 tandem lesions in its proximal to mid section, which are all varying from 70% to 90% stenosis.  It gives off an OM branch that does not appear to have any angiographic evidence of significant stenosis.  4. The RCA is a dominant vessel that arises normally from the right coronary cusp.  The RCA has about 50% stenosis in its ostial segment.  The mid to distal RCA is heavily calcified with diffuse disease of about 30% to 40%.  The PDA branch appears to have about 90% stenosis in its to distal segment.  The PLV branch is a small branch that does not appear to have any angiographic evidence of stenosis.  CONCLUSION:  Multivessel coronary artery disease as described above.     ? Abnormal thallium stress test 11/05/2016   ? Essential hypertension 09/22/2016   ? Family history of coronary artery disease 09/22/2016   ? 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 Observation for suspected cardiovascular disease 09/08/2016     01/28/2014  Exercise Stress exam   Conclusion:  No exercises induced chest pain, no ECG evidence of ischemia. PVC seen.  Hypertensive response to exercise  Medical thearpy with lifestyle modification.  Low risk study.     ? Chest pain 09/08/2016         Review of Systems   Constitutional: Negative.   HENT: Negative.    Eyes: Negative.    Cardiovascular: Positive for chest pain and palpitations.   Respiratory: Negative.    Endocrine: Negative.    Hematologic/Lymphatic: Negative.    Skin: Negative.    Musculoskeletal: Negative.    Gastrointestinal: Negative.    Genitourinary: Negative.    Neurological: Negative.    Psychiatric/Behavioral: The patient has insomnia.    Allergic/Immunologic: Negative.        Physical Exam  Pleasant male in no acute distress.  Appears given age.  Is companied by his wife  Pupils are equal round without scleral injection  Neck is supple with normal Cropp stroke and no bruits.  No masses or jugular venous abnormalities  Heart S1, S2 is normal.  Not appreciate any murmurs, clicks, or gallops.  No focal tenderness of the chest at the location he describes it as a sensation occurs  Lungs are clear to auscultation with good effort and symmetric falls in the chest no crackles or wheezes  Abdomen is flat and nontender with positive bowel sounds  Pulses are 2+, regular, and symmetric bilaterally radial as well as dorsalis pedis and posterior tibial patient  No significant skin abnormalities with normal skin turgor no edema    Cardiovascular Studies  Reviewed records from his emergency department visit    Cardiovascular Health Factors  Vitals BP Readings from Last 3 Encounters:   11/04/21 (!) 142/78   02/25/21 132/70   07/18/20 132/76     Wt Readings from Last 3 Encounters:   11/04/21 92.8 kg (204 lb 9.6 oz)   02/25/21 93 kg (205 lb)   10/10/20 90.7 kg (200 lb)     BMI Readings from Last 3 Encounters:   11/04/21 33.02 kg/m?   02/25/21 33.09 kg/m?   10/10/20 32.28 kg/m?      Smoking Social History     Tobacco Use   Smoking Status Never   Smokeless Tobacco Never      Lipid Profile Cholesterol   Date Value Ref Range Status   09/02/2021 103  Final     HDL   Date Value Ref Range Status   09/02/2021 27 (L) > OR=40 Final     LDL   Date Value Ref Range Status   09/02/2021 57  Final     Triglycerides   Date Value Ref Range Status   09/02/2021 108  Final      Blood Sugar Hemoglobin A1C   Date Value Ref Range Status   09/16/2020 5.6  Final     Glucose   Date Value Ref Range Status   10/29/2021 115  Final   09/02/2021 90  Final   09/16/2020 101  Final     Glucose, POC   Date Value Ref Range Status   12/02/2016 151 (H) 70 - 100 MG/DL Final   16/07/9603 540 (H) 70 - 100 MG/DL Final   98/08/9146 829 (H) 70 - 100 MG/DL Final          Problems Addressed Today  Encounter Diagnoses   Name Primary?   ? S/P CABG x 4 Yes   ? Family history of coronary artery disease    ? Palpitations    ? Insomnia, unspecified type        Assessment and Plan     New onset of palpitations.  No unstable findings.  Because of his history and risk factors and ongoing symptoms we will plan for 3-day Zio patch.  I am also going to arrange for him to undergo a treadmill stress echo.  In order to treat his insomnia we will start him on 10 mg of amitriptyline p.o. nightly.  If he is tolerating the medication but have suboptimal control of his insomnia could increase to 20 mg p.o. nightly.  Plan at this time is to contact him his results are available from these studies.  At that time will be determine further intervention or follow-up.         Current Medications (including today's revisions)  ? acetaminophen (TYLENOL) 500 mg tablet Take 500 mg by mouth as Needed for Pain. Max of 4,000 mg of acetaminophen in 24 hours.   ? allopurinol (ZYLOPRIM) 300 mg tablet Take 300 mg by mouth daily. Take with food.   ? amitriptyline (ELAVIL) 10 mg tablet Take one tablet by mouth at bedtime daily.   ? amLODIPine (NORVASC) 5 mg tablet TAKE 1 TABLET BY MOUTH IN THE MORNING AND 1/2 TABLET IN TH EVENING   ? aspirin EC 81 mg tablet Take 81 mg by mouth at bedtime daily. Take with food.    ? gabapentin (NEURONTIN) 300 mg capsule Take 300 mg by mouth daily.   ? levothyroxine (SYNTHROID) 50 mcg tablet Take 50 mcg by mouth daily 30 minutes before breakfast.   ? losartan (COZAAR) 100 mg tablet TAKE 1 TABLET BY  MOUTH EVERY DAY   ? meloxicam (MOBIC) 15 mg tablet Take 15 mg by mouth daily.   ? metoprolol tartrate 25 mg tablet Take one-half tablet by mouth in the morning and one tablet by mouth in the evening.   ? naproxen sodium (ALEVE) 220 mg capsule Take 1 tablet by mouth as Needed. Take with food.    ? pantoprazole DR (PROTONIX) 40 mg tablet Take 40 mg by mouth daily.   ? rosuvastatin (CRESTOR) 5 mg tablet Take one tablet by mouth daily.

## 2021-11-04 NOTE — Patient Instructions
Will have you wear heart monitor to assess heart rhythm  Schedule for cardiac stress test   Will call results when available  Start amitrityline at 10mg  at night to help with sleep may take second tablet as needed      Thank you for visiting our office today.         Orders Placed This Encounter    2D + DOPPLER ECHO    STRESS ECHO    amitriptyline (ELAVIL) 10 mg tablet    LONG-TERM CARDIAC MONITOR         Please allow 5-7 business days for our providers to review your results. All normal results will go to MyChart. If you do not have Mychart, it is strongly recommended to get this so you can easily view all your results. If you do not have mychart, we will attempt to call you once with normal lab and testing results. If we cannot reach you by phone with normal results, we will send you a letter.  If you have not heard the results of your testing after one week please give a call.       Your [facility] [place] Team Gabriel Rung, Brett Canales, Pilar Jarvis, and [facility])  phone number is [phone].

## 2021-11-04 NOTE — Progress Notes
Ambulatory (External) Cardiac Monitor Enrollment Record    Brand: Cher[employer])  Serial Number: [id number]  Location where monitor was placed:  [facility]   Start Time and Date: 11/04/21   Patient instructed to contact company phone number on the monitor box with questions regarding billing, placement, troubleshooting.     Alfredia Client, LPN

## 2021-11-12 ENCOUNTER — Encounter: Admit: 2021-11-12 | Discharge: 2021-11-12 | Payer: MEDICARE

## 2021-11-12 NOTE — Telephone Encounter
-----   Message from Valora Piccolo, RN sent at 11/12/2021  1:59 PM CST -----    ----- Message -----  From: [name], MD  Sent: 11/12/2021   1:54 PM CST  To: [facility] [place]    Let him know his heart rhythm is stable.  There are no heart rhythm changes happening when he has symptoms.  Thank you  ----- Message -----  From: [name], MD  Sent: 11/12/2021   1:52 PM CST  To: [name], MD

## 2021-11-27 ENCOUNTER — Encounter: Admit: 2021-11-27 | Discharge: 2021-11-27 | Payer: MEDICARE

## 2021-11-27 MED ORDER — AMLODIPINE 5 MG PO TAB
ORAL_TABLET | 3 refills | Status: AC
Start: 2021-11-27 — End: ?

## 2021-12-01 ENCOUNTER — Ambulatory Visit: Admit: 2021-12-01 | Discharge: 2021-12-01 | Payer: MEDICARE

## 2021-12-01 ENCOUNTER — Encounter: Admit: 2021-12-01 | Discharge: 2021-12-01 | Payer: MEDICARE

## 2021-12-01 DIAGNOSIS — G4700 Insomnia, unspecified: Secondary | ICD-10-CM

## 2021-12-01 DIAGNOSIS — I454 Nonspecific intraventricular block: Secondary | ICD-10-CM

## 2021-12-01 DIAGNOSIS — I208 Other forms of angina pectoris: Secondary | ICD-10-CM

## 2021-12-01 DIAGNOSIS — Z951 Presence of aortocoronary bypass graft: Secondary | ICD-10-CM

## 2021-12-01 DIAGNOSIS — I2510 Atherosclerotic heart disease of native coronary artery without angina pectoris: Secondary | ICD-10-CM

## 2021-12-01 DIAGNOSIS — Z8249 Family history of ischemic heart disease and other diseases of the circulatory system: Secondary | ICD-10-CM

## 2021-12-01 DIAGNOSIS — I10 Essential (primary) hypertension: Secondary | ICD-10-CM

## 2021-12-01 DIAGNOSIS — R002 Palpitations: Secondary | ICD-10-CM

## 2021-12-01 MED ORDER — PERFLUTREN LIPID MICROSPHERES 1.1 MG/ML IV SUSP
1-10 mL | Freq: Once | INTRAVENOUS | 0 refills | Status: CP | PRN
Start: 2021-12-01 — End: ?

## 2021-12-01 NOTE — Progress Notes
Referring Physician (MD/DO):  SHT   Requesting Physician (MD/DO):  SHT   Date of Last Office/Hosp Visit:  11/04/21       >24 Hour Assessment         Risk Factors:    Diabetes: no   Hypertension: yes   Cholesterol: yes   Smoker: No      Quit: not applicable           Since last MAC physician examination has the patient experienced:    Hospitalization: no     Emergency Room visit: no    Change in CV symptoms:    Chest pain: No on a scale of 1 to 10 (1 = low pain, 10 = severe pain)   SOB: no   Palpitations: no   Pre-syncope: no   Syncope: no   TIA/CVA: no   HTN: no     Other Indications/History: yes - CAD, palpitations    Change in Medication: no    Allergies:   Allergies   Allergen Reactions    Atorvastatin MUSCLE PAIN    Sulfa (Sulfonamide Antibiotics) UNKNOWN     Unknown childhood reaction        Additional procedural comments: Pt developed SOB and fatigue. Pt denied CP throughout exam.

## 2021-12-02 ENCOUNTER — Encounter: Admit: 2021-12-02 | Discharge: 2021-12-02 | Payer: MEDICARE

## 2021-12-02 NOTE — Telephone Encounter
Called and discussed results with patient.  No questions at this time.  Pt will callback with any questions, concerns or problems.

## 2021-12-02 NOTE — Telephone Encounter
-----   Message from Valora Piccolo, RN sent at 12/02/2021 11:34 AM CST -----    ----- Message -----  From: [name], MD  Sent: 12/02/2021  11:32 AM CST  To: [facility] Card Team [facility]    Let him know his cardiac stress test showed good exercise capacity and did not show any findings for poor blood supply to his heart.  Recent rhythm monitor was normal as well.  thanks  ----- Message -----  From: Glynda Jaeger, MD  Sent: 12/02/2021   9:13 AM CST  To: [name], MD

## 2021-12-03 ENCOUNTER — Encounter: Admit: 2021-12-03 | Discharge: 2021-12-03 | Payer: MEDICARE

## 2021-12-04 ENCOUNTER — Encounter: Admit: 2021-12-04 | Discharge: 2021-12-04 | Payer: MEDICARE

## 2021-12-04 ENCOUNTER — Ambulatory Visit: Admit: 2021-12-04 | Discharge: 2021-12-05 | Payer: 59

## 2021-12-04 DIAGNOSIS — Z01810 Encounter for preprocedural cardiovascular examination: Secondary | ICD-10-CM

## 2021-12-04 DIAGNOSIS — G4700 Insomnia, unspecified: Secondary | ICD-10-CM

## 2021-12-04 DIAGNOSIS — E669 Obesity, unspecified: Secondary | ICD-10-CM

## 2021-12-04 DIAGNOSIS — E785 Hyperlipidemia, unspecified: Secondary | ICD-10-CM

## 2021-12-04 DIAGNOSIS — R079 Chest pain, unspecified: Secondary | ICD-10-CM

## 2021-12-04 DIAGNOSIS — K219 Gastro-esophageal reflux disease without esophagitis: Secondary | ICD-10-CM

## 2021-12-04 DIAGNOSIS — M109 Gout, unspecified: Secondary | ICD-10-CM

## 2021-12-04 DIAGNOSIS — Z951 Presence of aortocoronary bypass graft: Secondary | ICD-10-CM

## 2021-12-04 DIAGNOSIS — I2510 Atherosclerotic heart disease of native coronary artery without angina pectoris: Secondary | ICD-10-CM

## 2021-12-04 DIAGNOSIS — I454 Nonspecific intraventricular block: Secondary | ICD-10-CM

## 2021-12-04 DIAGNOSIS — E039 Hypothyroidism, unspecified: Secondary | ICD-10-CM

## 2021-12-04 DIAGNOSIS — R9439 Abnormal result of other cardiovascular function study: Secondary | ICD-10-CM

## 2021-12-04 NOTE — Progress Notes
Date of Service: 12/04/2021    Billy May is a 71 y.o. male.       HPI     Patient is a 71 year old Caucasian male past medical history of hypertension, and family history of early coronary artery disease and multiple male relatives.  He had screening testing done because of his family history was found to have multivessel disease.  He underwent multivessel CABG without any complications or events.  He has maintained preserved left ventricular ejection fraction, never developed angina or anginal equivalent type symptoms.  When I saw the patient in January he was having issues with a vibration type sensation in his left lateral chest.  Was more prevalent at rest.  Was having some mild increased shortness of breath but no syncope or near syncopal type symptoms.  No orthopnea or PND.  Otherwise been compliant tolerant medications.  Has been struggling with neuropathy type symptoms and has known cervical spine stenosis with plans for surgery intervention.  Is also looking forward to his trip to [place] next week.  He underwent 3-day Zio patch which showed sinus rhythm with his interventricular conduction abnormality at baseline.  Did not develop any other conduction abnormalities, no significant ectopy or change in rhythm.  Maximal heart was 95 bpm.  He does take low-dose metoprolol, again no symptoms for lightheadedness or true activity intolerance.  Reports he did have symptoms of that vibration sensation or palpitation in his left chest while he had the monitor on.         Vitals:    12/04/21 0757   BP: (!) 150/80   BP Source: Arm, Left Upper   Pulse: 59   SpO2: 97%   O2 Device: None (Room air)   PainSc: Zero   Weight: 92.8 kg (204 lb 9.6 oz)   Height: 167.6 cm (5' 6)     Body mass index is 33.02 kg/m?Marland Kitchen     Past Medical History  Patient Active Problem List    Diagnosis Date Noted   ? 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 Acute blood loss anemia 11/27/2016   ? Chronic diastolic CHF (congestive heart failure), NYHA class 2 (HCC) 11/26/2016   ? Abnormal cardiovascular stress test    ? Obesity, Class I, BMI 30-34.9    ? GERD (gastroesophageal reflux disease)    ? Coronary artery disease involving native coronary artery of native heart without angina pectoris 11/24/2016     Added automatically from request for surgery [phone]     ? CAD (coronary artery disease) 11/19/2016     11/19/16 Cardiac Catheterization by Dr. Steward Ros  ANGIOGRAPHY:    1. The left main arises normally from the left coronary cusp and bifurcates into the LAD and the left circumflex artery.  The left main has about a 30% stenosis in its distal segment.    2. The LAD arises normally from the left main coronary artery.  The LAD is a type 3 vessel.  The LAD has diffuse 40% to 50% disease in its proximal, as well as in its mid section, and it is heavily calcified in the same area.  The LAD also has an 80% stenosis in its mid segment.  Distal to this, the LAD has about 20% to 30% stenosis in the mid section after the second diagonal branch.  The LAD gives off 3 diagonal branches.  The first of the 3 diagonal branches has about 50% to 60% stenosis in its  proximal segment.     3. The circumflex artery arises normally from the left main coronary artery.  The left circumflex artery has 4-5 tandem lesions in its proximal to mid section, which are all varying from 70% to 90% stenosis.  It gives off an OM branch that does not appear to have any angiographic evidence of significant stenosis.  4. The RCA is a dominant vessel that arises normally from the right coronary cusp.  The RCA has about 50% stenosis in its ostial segment.  The mid to distal RCA is heavily calcified with diffuse disease of about 30% to 40%.  The PDA branch appears to have about 90% stenosis in its to distal segment.  The PLV branch is a small branch that does not appear to have any angiographic evidence of stenosis.  CONCLUSION:  Multivessel coronary artery disease as described above.     ? Abnormal thallium stress test 11/05/2016   ? Essential hypertension 09/22/2016   ? Family history of coronary artery disease 09/22/2016   ? 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 Observation for suspected cardiovascular disease 09/08/2016     01/28/2014  Exercise Stress exam   Conclusion:  No exercises induced chest pain, no ECG evidence of ischemia. PVC seen.  Hypertensive response to exercise  Medical thearpy with lifestyle modification.  Low risk study.     ? Chest pain 09/08/2016         Review of Systems   Constitutional: Negative.   HENT: Negative.    Eyes: Negative.    Cardiovascular: Negative.    Respiratory: Negative.    Endocrine: Negative.    Hematologic/Lymphatic: Negative.    Skin: Negative.    Musculoskeletal: Positive for arthritis, back pain and gout.   Gastrointestinal: Negative.    Genitourinary: Negative.    Neurological: Positive for paresthesias.   Psychiatric/Behavioral: Negative.    Allergic/Immunologic: Negative.        Physical Exam  Awake and alert in no acute distress.  Burly gentleman.  Pupils are equal react without scleral injection  Neck is supple normal carotid upstroke and no bruits.  No masses no jugular venous abnormalities  Chest is symmetric with well-healed vertical scar down the central chest.  No focal pain or deficits with palpation  Heart S1, S2 that are normal.  No murmurs, clicks, or gallops  Lungs are clear to auscultation  Abdomen is protuberant but soft and nontender  Pulse are 2+, regular, and symmetric bilaterally radial locations  No significant peripheral edema or significant skin abnormalities    Cardiovascular Studies      Cardiovascular Health Factors  Vitals BP Readings from Last 3 Encounters:   12/04/21 (!) 150/80   12/01/21 138/72   11/04/21 (!) 142/78     Wt Readings from Last 3 Encounters:   12/04/21 92.8 kg (204 lb 9.6 oz)   12/01/21 93.4 kg (206 lb)   11/04/21 92.8 kg (204 lb 9.6 oz)     BMI Readings from Last 3 Encounters:   12/04/21 33.02 kg/m?   12/01/21 33.25 kg/m? 11/04/21 33.02 kg/m?      Smoking Social History     Tobacco Use   Smoking Status Never   Smokeless Tobacco Never      Lipid Profile Cholesterol   Date Value Ref Range Status   09/02/2021 103  Final     HDL   Date Value Ref Range Status   09/02/2021 27 (L) > OR=40 Final     LDL   Date  Value Ref Range Status   09/02/2021 57  Final     Triglycerides   Date Value Ref Range Status   09/02/2021 108  Final      Blood Sugar Hemoglobin A1C   Date Value Ref Range Status   09/16/2020 5.6  Final     Glucose   Date Value Ref Range Status   10/29/2021 115  Final   09/02/2021 90  Final   09/16/2020 101  Final     Glucose, POC   Date Value Ref Range Status   12/02/2016 151 (H) 70 - 100 MG/DL Final   16/07/9603 540 (H) 70 - 100 MG/DL Final   98/08/9146 829 (H) 70 - 100 MG/DL Final          Problems Addressed Today  Encounter Diagnoses   Name Primary?   ? Preop cardiovascular exam Yes   ? S/P CABG x 4    ? Insomnia, unspecified type    ? IVCD (intraventricular conduction defect)        Assessment and Plan     Recent evaluation for abnormal sensation in his chest did not reveal changes cardiovascular status.  Patient shows good exercise capacity with no findings for ischemia on his ECG and exercise.  Baseline left ventricular ejection fraction is normal with no findings for wall motion abnormalities at his maximal exercise capacity.  His stress test was submaximal because of less than 85% maximal age-predicted heart rate achieved.  However, he had no symptoms and overall tolerance and exercise is good for age.  Recent rhythm monitor also confirmed stable heart rhythm and conduction.  He does feel improved with the addition of the amitriptyline for his sleep.  Did not want to change the dose at this time.  At this time risk factors for cardiovascular disease are well addressed.  He is compliant tolerant with his medications.  The discomfort he has developed in that left chest is unlikely to be cardiac.  Supportive measures are advised.  No restrictions to travel.  Would advise proceeding to surgery for his neuropathy symptoms and involvement of the cervical spine as needed.  Routine follow-up can be in approximate 6 months.         Current Medications (including today's revisions)  ? acetaminophen (TYLENOL) 500 mg tablet Take one tablet by mouth as Needed for Pain. Max of 4,000 mg of acetaminophen in 24 hours.   ? allopurinol (ZYLOPRIM) 300 mg tablet Take one tablet by mouth daily. Take with food.   ? amitriptyline (ELAVIL) 10 mg tablet Take one tablet by mouth at bedtime daily.   ? amLODIPine (NORVASC) 5 mg tablet TAKE 1 TABLET BY MOUTH IN THE MORNING AND 1/2 TABLET IN THE EVENING   ? aspirin EC 81 mg tablet Take one tablet by mouth at bedtime daily. Take with food.   ? gabapentin (NEURONTIN) 300 mg capsule Take one capsule by mouth daily.   ? levothyroxine (SYNTHROID) 50 mcg tablet Take one tablet by mouth daily 30 minutes before breakfast.   ? losartan (COZAAR) 100 mg tablet TAKE 1 TABLET BY MOUTH EVERY DAY   ? meloxicam (MOBIC) 15 mg tablet Take one tablet by mouth daily.   ? metoprolol tartrate 25 mg tablet Take one-half tablet by mouth in the morning and one tablet by mouth in the evening.   ? naproxen sodium (ALEVE) 220 mg capsule Take one capsule by mouth as Needed. Take with food.   ? nitroglycerin (NITROSTAT) 0.4 mg tablet Place one  tablet under tongue every 5 minutes as needed for Chest Pain. Max of 3 tablets, call 911.   ? pantoprazole DR (PROTONIX) 40 mg tablet Take one tablet by mouth daily.   ? rosuvastatin (CRESTOR) 5 mg tablet Take one tablet by mouth daily.

## 2022-01-14 ENCOUNTER — Encounter: Admit: 2022-01-14 | Discharge: 2022-01-14 | Payer: MEDICARE

## 2022-01-14 DIAGNOSIS — Z951 Presence of aortocoronary bypass graft: Secondary | ICD-10-CM

## 2022-01-14 DIAGNOSIS — E782 Mixed hyperlipidemia: Secondary | ICD-10-CM

## 2022-01-14 DIAGNOSIS — I2510 Atherosclerotic heart disease of native coronary artery without angina pectoris: Secondary | ICD-10-CM

## 2022-01-14 LAB — LIPID PROFILE
CHOLESTEROL/HDL %: 4
CHOLESTEROL: 101 pg — ABNORMAL LOW (ref 26–34)
HDL: 26 % — ABNORMAL LOW (ref >=40)
LDL: 51 K/UL (ref 150–400)
TRIGLYCERIDES: 124 g/dL — ABNORMAL LOW (ref 32.0–36.0)
VLDL: 25 FL (ref 7–11)

## 2022-01-14 LAB — ALT (SGPT): ALT: 26

## 2022-01-14 NOTE — Telephone Encounter
-----   Message from Hester Mates, MD sent at 01/14/2022 12:42 PM CDT -----  LDL cholesterol looks good.  Please let him know.  Thanks.  SBG  ----- Message -----  From: Rogelia Boga, RN  Sent: 01/14/2022  12:24 PM CDT  To: Hester Mates, MD    Labs for your review, LDL 51, pt is on rosuvastatin 5mg  daily.  Please let me or the [facility] RNs know if you have any recommendations.  Thank you!

## 2022-01-16 ENCOUNTER — Encounter: Admit: 2022-01-16 | Discharge: 2022-01-16 | Payer: MEDICARE

## 2022-01-16 MED ORDER — LOSARTAN 100 MG PO TAB
ORAL_TABLET | ORAL | 3 refills | 90.00000 days | Status: AC
Start: 2022-01-16 — End: ?

## 2022-02-25 ENCOUNTER — Encounter: Admit: 2022-02-25 | Discharge: 2022-02-25 | Payer: MEDICARE

## 2022-02-25 DIAGNOSIS — Z8249 Family history of ischemic heart disease and other diseases of the circulatory system: Secondary | ICD-10-CM

## 2022-02-25 DIAGNOSIS — I10 Essential (primary) hypertension: Secondary | ICD-10-CM

## 2022-02-25 DIAGNOSIS — I208 Other forms of angina pectoris: Secondary | ICD-10-CM

## 2022-02-25 DIAGNOSIS — R9439 Abnormal result of other cardiovascular function study: Secondary | ICD-10-CM

## 2022-02-25 DIAGNOSIS — I25118 Atherosclerotic heart disease of native coronary artery with other forms of angina pectoris: Secondary | ICD-10-CM

## 2022-02-25 MED ORDER — METOPROLOL TARTRATE 25 MG PO TAB
ORAL_TABLET | ORAL | 3 refills | 90.00000 days | Status: AC
Start: 2022-02-25 — End: ?

## 2022-03-18 ENCOUNTER — Encounter: Admit: 2022-03-18 | Discharge: 2022-03-18 | Payer: MEDICARE

## 2022-03-18 MED ORDER — ROSUVASTATIN 5 MG PO TAB
5 mg | ORAL_TABLET | Freq: Every day | ORAL | 3 refills | 90.00000 days | Status: AC
Start: 2022-03-18 — End: ?

## 2022-03-18 MED ORDER — ROSUVASTATIN 5 MG PO TAB
ORAL_TABLET | ORAL | 3 refills | 90.00000 days | Status: DC
Start: 2022-03-18 — End: 2022-03-18

## 2022-04-30 ENCOUNTER — Encounter: Admit: 2022-04-30 | Discharge: 2022-04-30 | Payer: MEDICARE

## 2022-04-30 MED ORDER — AMITRIPTYLINE 10 MG PO TAB
10 mg | ORAL_TABLET | Freq: Every evening | ORAL | 3 refills | Status: AC
Start: 2022-04-30 — End: ?

## 2022-09-01 ENCOUNTER — Encounter: Admit: 2022-09-01 | Discharge: 2022-09-01 | Payer: MEDICARE

## 2022-09-01 ENCOUNTER — Ambulatory Visit: Admit: 2022-09-01 | Discharge: 2022-09-02 | Payer: MEDICARE

## 2022-09-01 DIAGNOSIS — Z01810 Encounter for preprocedural cardiovascular examination: Secondary | ICD-10-CM

## 2022-09-01 DIAGNOSIS — K219 Gastro-esophageal reflux disease without esophagitis: Secondary | ICD-10-CM

## 2022-09-01 DIAGNOSIS — R9439 Abnormal result of other cardiovascular function study: Secondary | ICD-10-CM

## 2022-09-01 DIAGNOSIS — I2510 Atherosclerotic heart disease of native coronary artery without angina pectoris: Secondary | ICD-10-CM

## 2022-09-01 DIAGNOSIS — E039 Hypothyroidism, unspecified: Secondary | ICD-10-CM

## 2022-09-01 DIAGNOSIS — E785 Hyperlipidemia, unspecified: Secondary | ICD-10-CM

## 2022-09-01 DIAGNOSIS — R079 Chest pain, unspecified: Secondary | ICD-10-CM

## 2022-09-01 DIAGNOSIS — M109 Gout, unspecified: Secondary | ICD-10-CM

## 2022-09-01 DIAGNOSIS — E669 Obesity, unspecified: Secondary | ICD-10-CM

## 2022-09-01 DIAGNOSIS — I454 Nonspecific intraventricular block: Secondary | ICD-10-CM

## 2022-09-01 DIAGNOSIS — G4700 Insomnia, unspecified: Secondary | ICD-10-CM

## 2022-09-01 DIAGNOSIS — Z951 Presence of aortocoronary bypass graft: Secondary | ICD-10-CM

## 2022-09-01 MED ORDER — AMLODIPINE 5 MG PO TAB
5 mg | ORAL_TABLET | Freq: Two times a day (BID) | ORAL | 4 refills | Status: AC
Start: 2022-09-01 — End: ?

## 2022-09-01 NOTE — Progress Notes
Date of Service: 09/01/2022    Billy May is a 71 y.o. male.       HPI     Patient is now a 71 year old Caucasian male past medical history of coronary artery disease and family history of early coronary artery disease-was detected by minimal symptoms are more by risk.  He has preserved left ventricular ejection fraction underwent three-vessel CABG in 2018 with no recurrent symptoms or events.  He was having his vibration sensation in his chest when I saw him previously and underwent a rhythm monitor that was normal.  Stress echo cardiogram showed good exercise capacity, blunted heart rate response on beta-blocker therapy and otherwise no findings for ischemia and stable heart function and findings.  Since that time is continued do well.  Did go on his trip to [place] without difficulty.  Continues to struggle with symptoms from his back disorder.  He underwent his cervical spine surgery and tolerated that well and now has plans to undergo his lower back surgery in February.  No other chest pain, palpitations, or lightheadedness.  No orthopnea or PND.  Still has difficulty with sleep at night, and the amitriptyline does not seem to have a having the effect it was early on with this treatment.  Does struggle with frequent nocturia, has not been able to go up on his dose of Flomax because he becomes more lightheaded and dizzy especially with position change when he is on a higher dose.         Vitals:    09/01/22 0943   BP: (!) 150/76   BP Source: Arm, Left Upper   Pulse: 56   SpO2: 95%   O2 Device: None (Room air)   PainSc: Zero   Weight: 92.1 kg (203 lb)   Height: 167.6 cm (5' 6)     Body mass index is 32.77 kg/m?Marland Kitchen     Past Medical History  Patient Active Problem List    Diagnosis Date Noted   ? 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 Acute blood loss anemia 11/27/2016   ? Chronic diastolic CHF (congestive heart failure), NYHA class 2 (HCC) 11/26/2016   ? Abnormal cardiovascular stress test    ? Obesity, Class I, BMI 30-34.9    ? GERD (gastroesophageal reflux disease)    ? Coronary artery disease involving native coronary artery of native heart without angina pectoris 11/24/2016     Added automatically from request for surgery [phone]     ? CAD (coronary artery disease) 11/19/2016     11/19/16 Cardiac Catheterization by Dr. Steward Ros  ANGIOGRAPHY:    1. The left main arises normally from the left coronary cusp and bifurcates into the LAD and the left circumflex artery.  The left main has about a 30% stenosis in its distal segment.    2. The LAD arises normally from the left main coronary artery.  The LAD is a type 3 vessel.  The LAD has diffuse 40% to 50% disease in its proximal, as well as in its mid section, and it is heavily calcified in the same area.  The LAD also has an 80% stenosis in its mid segment.  Distal to this, the LAD has about 20% to 30% stenosis in the mid section after the second diagonal branch.  The LAD gives off 3 diagonal branches.  The first of the 3 diagonal branches has about 50% to 60% stenosis in its proximal segment.  3. The circumflex artery arises normally from the left main coronary artery.  The left circumflex artery has 4-5 tandem lesions in its proximal to mid section, which are all varying from 70% to 90% stenosis.  It gives off an OM branch that does not appear to have any angiographic evidence of significant stenosis.  4. The RCA is a dominant vessel that arises normally from the right coronary cusp.  The RCA has about 50% stenosis in its ostial segment.  The mid to distal RCA is heavily calcified with diffuse disease of about 30% to 40%.  The PDA branch appears to have about 90% stenosis in its to distal segment.  The PLV branch is a small branch that does not appear to have any angiographic evidence of stenosis.  CONCLUSION:  Multivessel coronary artery disease as described above.     ? Abnormal thallium stress test 11/05/2016   ? Essential hypertension 09/22/2016   ? Family history of coronary artery disease 09/22/2016   ? 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 Observation for suspected cardiovascular disease 09/08/2016     01/28/2014  Exercise Stress exam   Conclusion:  No exercises induced chest pain, no ECG evidence of ischemia. PVC seen.  Hypertensive response to exercise  Medical thearpy with lifestyle modification.  Low risk study.     ? Chest pain 09/08/2016         Review of Systems   Constitutional: Negative.   HENT: Negative.    Eyes: Negative.    Cardiovascular: Negative.    Respiratory: Negative.    Endocrine: Negative.    Hematologic/Lymphatic: Negative.    Skin: Negative.    Musculoskeletal: Negative.    Gastrointestinal: Negative.    Genitourinary: Negative.    Neurological: Negative.    Psychiatric/Behavioral: Negative.    Allergic/Immunologic: Negative.        Physical Exam  Awake and alert no acute distress, appears given age  Pupils are equal reactive without scleral injection  Neck is supple with normal carotid upstroke and no bruits.  No masses or jugular venous abnormalities  Chest is symmetric and lungs are clear to auscultation with good effort  No focal deficits or asymmetry is seen at the sternum and chest wall  Heart S1, S2 that are normal.  I do not appreciate significant murmur, click, or gallop  Abdomen is protuberant but soft  Pulses are 2+, regular, and symmetric bilaterally radial as well as pedal location  No significant peripheral edema  Does have somewhat of a stiff movement and gait but no imbalance    Cardiovascular Studies      Cardiovascular Health Factors  Vitals BP Readings from Last 3 Encounters:   09/01/22 (!) 150/76   12/04/21 (!) 150/80   12/01/21 138/72     Wt Readings from Last 3 Encounters:   09/01/22 92.1 kg (203 lb)   12/04/21 92.8 kg (204 lb 9.6 oz)   12/01/21 93.4 kg (206 lb)     BMI Readings from Last 3 Encounters:   09/01/22 32.77 kg/m?   12/04/21 33.02 kg/m?   12/01/21 33.25 kg/m?      Smoking Social History     Tobacco Use   Smoking Status Never Smokeless Tobacco Never      Lipid Profile Cholesterol   Date Value Ref Range Status   01/14/2022 101  Final     HDL   Date Value Ref Range Status   01/14/2022 26 (L) >=40 Final     LDL  Date Value Ref Range Status   01/14/2022 51  Final     Triglycerides   Date Value Ref Range Status   01/14/2022 124  Final      Blood Sugar Hemoglobin A1C   Date Value Ref Range Status   09/16/2020 5.6  Final     Glucose   Date Value Ref Range Status   10/29/2021 115  Final   09/02/2021 90  Final   09/16/2020 101  Final     Glucose, POC   Date Value Ref Range Status   12/02/2016 151 (H) 70 - 100 MG/DL Final   30/86/5784 696 (H) 70 - 100 MG/DL Final   29/52/8413 244 (H) 70 - 100 MG/DL Final          Problems Addressed Today  Encounter Diagnoses   Name Primary?   ? Preop cardiovascular exam    ? S/P CABG x 4    ? Insomnia, unspecified type    ? IVCD (intraventricular conduction defect)        Assessment and Plan     Heart and vascular findings are clinically stable.  Does have suboptimal control of his blood pressure.  I also have concerns regarding polypharmacy issues.  Because he has had issues with low heart rate and dizziness with certain medication adjustments we will bring him off the Toprol tartrate.  We will make his amlodipine 5 mg p.o. twice daily.  He can contact me any questions or concerns.  My hope being that is we simplify his medication regimen may be for effective for him overall.  For his sleep disorder we will increase the amitriptyline to 2 tablets at night, if he wishes to stay on a higher dose we could change that to 25 mg going forward.  May proceed to his planned back surgery at this time.  Notify me if there is any other questions or concerns before the surgery takes place.  Routine follow-up can be in 6 to 12 months.         Current Medications (including today's revisions)  ? allopurinol (ZYLOPRIM) 300 mg tablet Take one tablet by mouth daily. Take with food.   ? amitriptyline (ELAVIL) 10 mg tablet TAKE ONE TABLET BY MOUTH AT BEDTIME DAILY.   ? amLODIPine (NORVASC) 5 mg tablet Take one tablet by mouth twice daily. Change in dose.  Stop metoprolol.   ? aspirin EC 81 mg tablet Take one tablet by mouth at bedtime daily. Take with food.   ? gabapentin (NEURONTIN) 300 mg capsule Take one capsule by mouth daily.   ? levothyroxine (SYNTHROID) 50 mcg tablet Take one tablet by mouth daily 30 minutes before breakfast.   ? losartan (COZAAR) 100 mg tablet TAKE 1 TABLET BY MOUTH EVERY DAY   ? meloxicam (MOBIC) 15 mg tablet Take one tablet by mouth daily.   ? naproxen sodium (ALEVE) 220 mg capsule Take one capsule by mouth as Needed. Take with food.   ? nitroglycerin (NITROSTAT) 0.4 mg tablet Place one tablet under tongue every 5 minutes as needed for Chest Pain. Max of 3 tablets, call 911.   ? pantoprazole DR (PROTONIX) 40 mg tablet Take one tablet by mouth daily.   ? rosuvastatin (CRESTOR) 5 mg tablet Take one tablet by mouth daily.   ? tamsulosin (FLOMAX) 0.4 mg capsule Take one capsule by mouth at bedtime daily.

## 2022-11-03 ENCOUNTER — Encounter: Admit: 2022-11-03 | Discharge: 2022-11-03 | Payer: MEDICARE

## 2022-11-03 MED ORDER — LOSARTAN 100 MG PO TAB
ORAL_TABLET | ORAL | 3 refills | 90.00000 days | Status: AC
Start: 2022-11-03 — End: ?

## 2023-04-09 ENCOUNTER — Encounter: Admit: 2023-04-09 | Discharge: 2023-04-09 | Payer: MEDICARE

## 2023-04-09 NOTE — Telephone Encounter
From: [name], MD   Sent: 04/09/2023   2:42 PM CDT   To: [facility] Nurse [facility] Card Team [facility]     He is clinically stable for surgery does not require cardiovascular evaluation prior to this treatment.  Thanks     Updated clearance faxed to [facility].

## 2023-04-17 IMAGING — MR T-spine^Routine
7 series · 44 of 48 positions shown · non-contrast
Comparison: none

[Series 5: T2 · sagittal · 3.0mm · 0.62mm/px · 6 of 15 slices shown (1 of 3)]
[im 1/15]
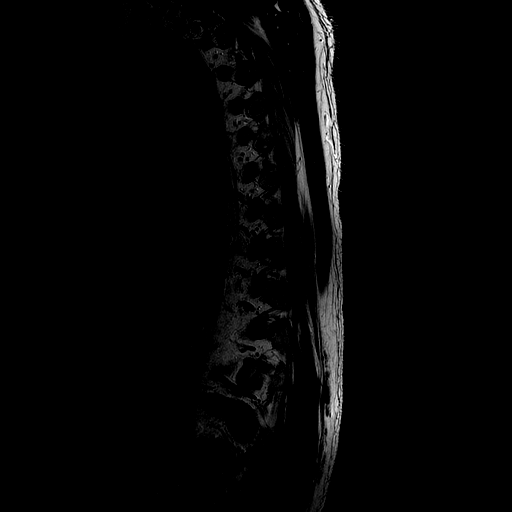
[im 3/15]
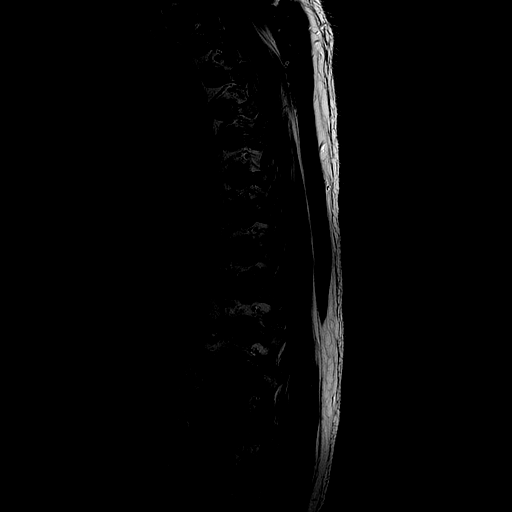
[im 6/15]
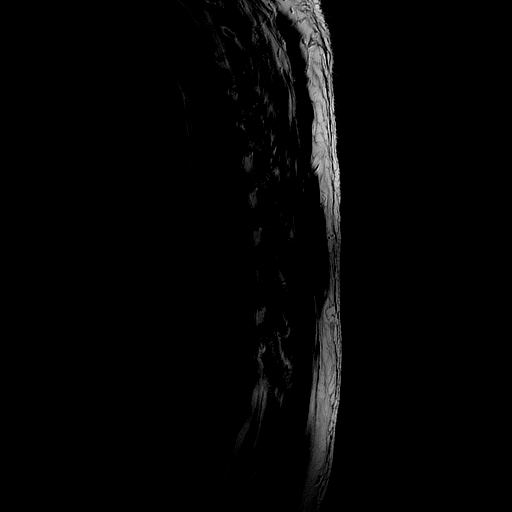
[im 9/15]
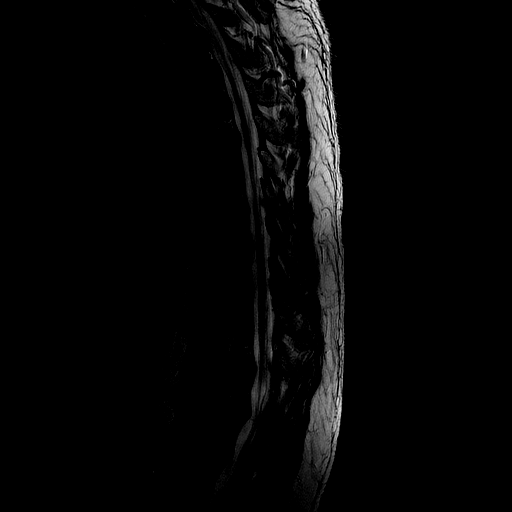
[im 12/15]
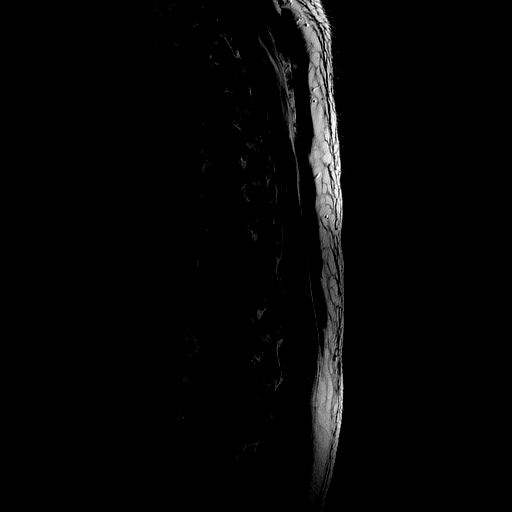
[im 15/15]
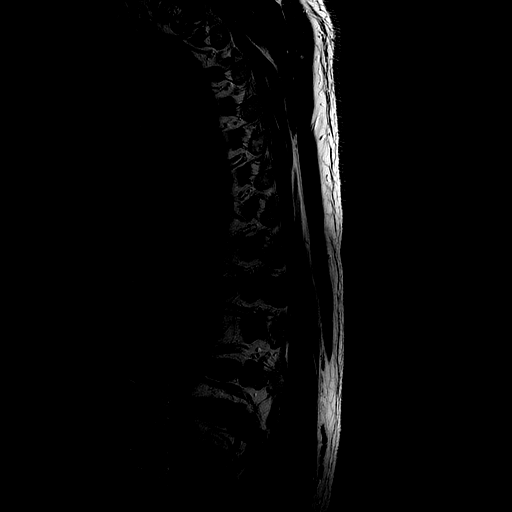

[Series 7: T1 · sagittal · 3.0mm · 0.71mm/px · 5 of 15 slices shown (1 of 3)]
[im 1/15]
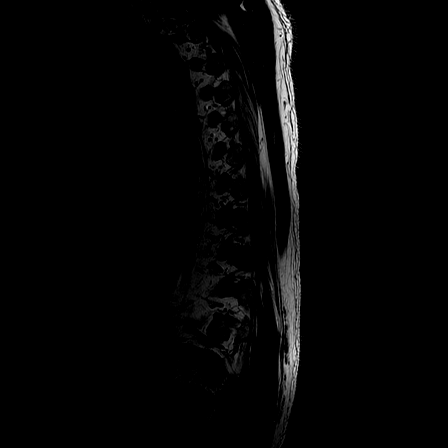
[im 4/15]
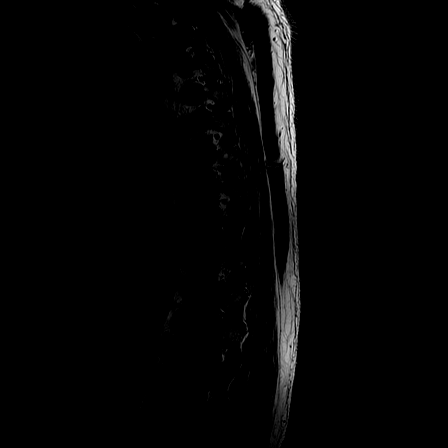
[im 8/15]
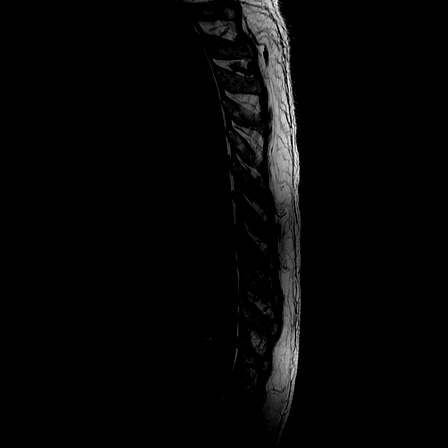
[im 11/15]
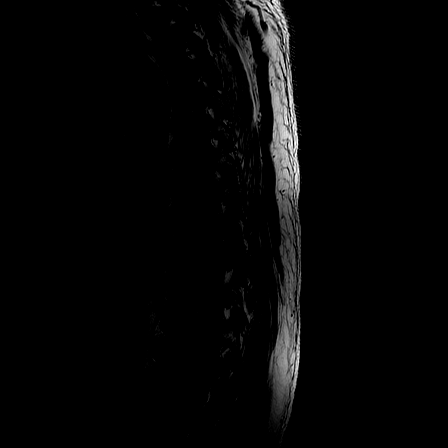
[im 15/15]
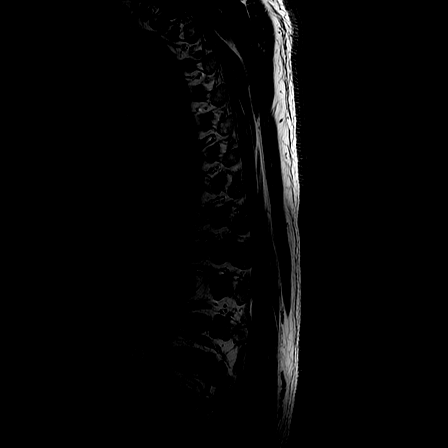

[Series 8: T2 · axial · 5.0mm · 0.62mm/px · z∈[-49,+121]mm · 8 of 24 slices shown (2 of 3)]
[im 1/24]
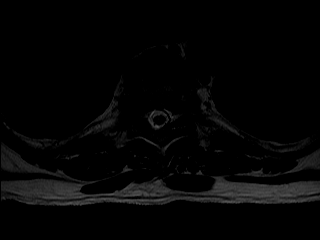
[im 4/24]
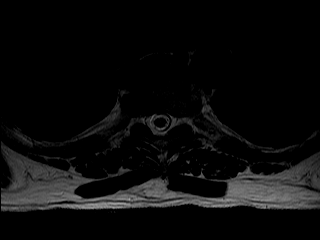
[im 7/24]
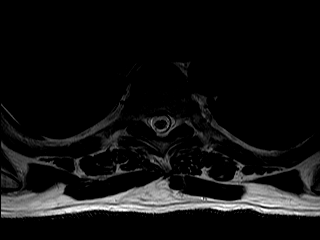
[im 10/24]
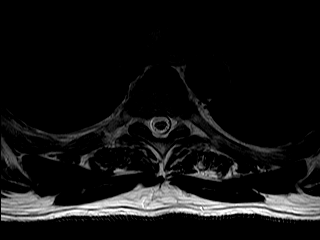
[im 14/24]
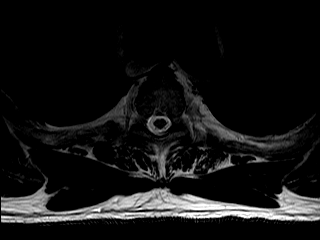
[im 17/24]
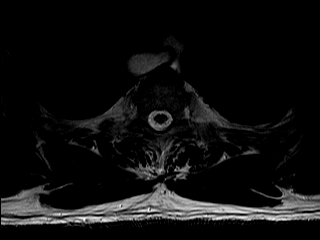
[im 20/24]
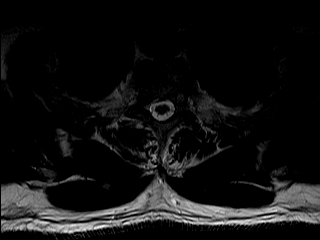
[im 24/24]
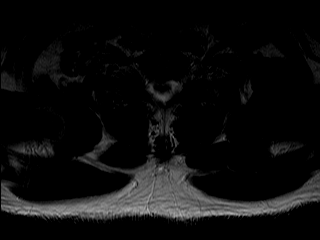

[Series 9: T2 · axial · 5.0mm · 0.62mm/px · z∈[-178,-7]mm · 8 of 24 slices shown (3 of 3)]
[im 1/24]
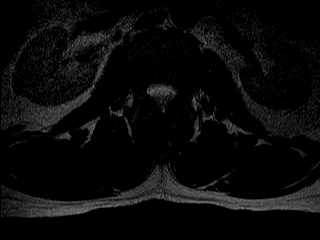
[im 4/24]
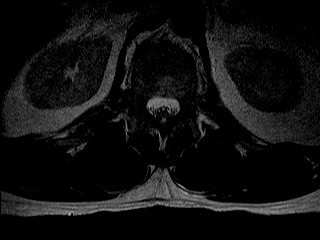
[im 7/24]
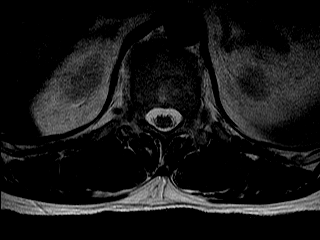
[im 10/24]
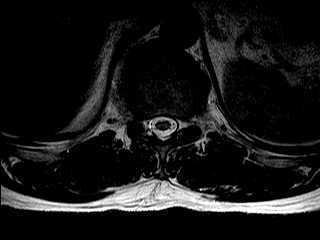
[im 14/24]
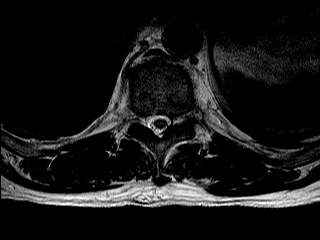
[im 17/24]
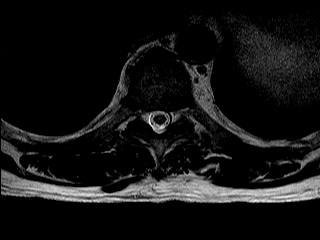
[im 20/24]
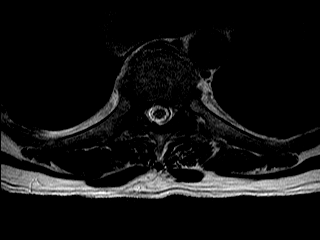
[im 24/24]
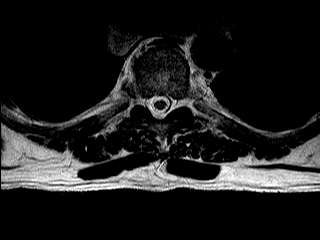

[Series 10: T1 · axial · 5.0mm · 0.78mm/px · z∈[-34,+122]mm · 8 of 22 slices shown (2 of 3)]
[im 1/22]
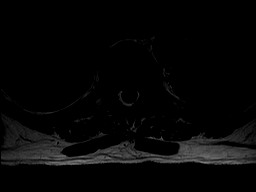
[im 4/22]
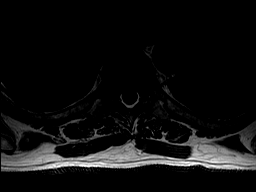
[im 7/22]
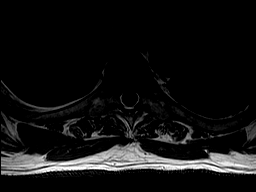
[im 10/22]
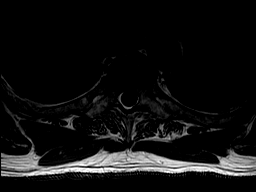
[im 13/22]
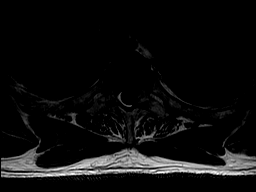
[im 16/22]
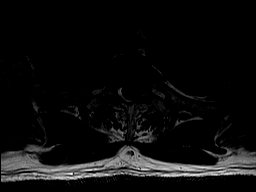
[im 19/22]
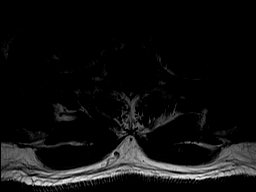
[im 22/22]
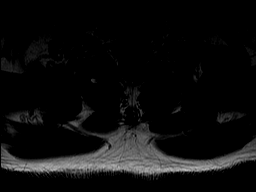

[Series 11: T1 · axial · 5.0mm · 0.78mm/px · z∈[-176,-6]mm · 8 of 24 slices shown (3 of 3)]
[im 1/24]
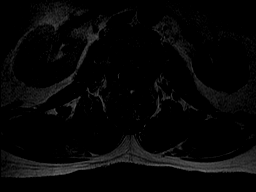
[im 4/24]
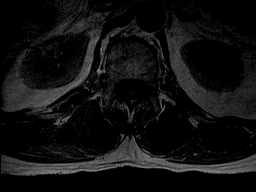
[im 7/24]
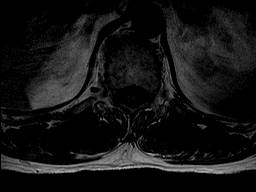
[im 10/24]
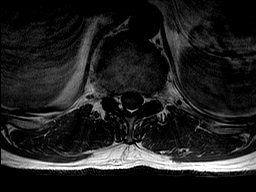
[im 14/24]
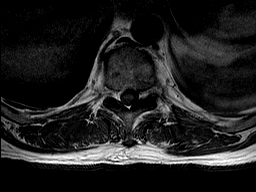
[im 17/24]
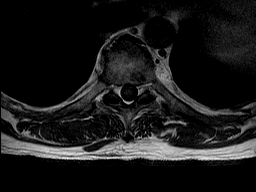
[im 20/24]
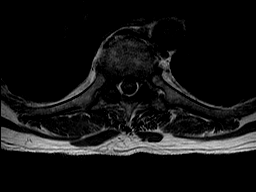
[im 24/24]
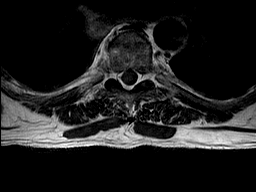

[Series 12: STIR · sagittal · 3.0mm · 0.62mm/px · 1 of 15 slices shown]
[im 1/15]
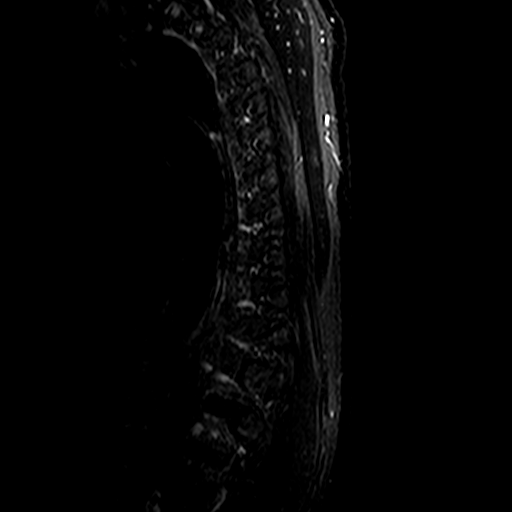

[44 of 48 positions shown; findings below may reference images not displayed]

DIAGNOSTIC STUDIES

EXAM

MRI of thoracic spine without contrast.

INDICATION

see order
WORSENING BACK PAIN WITH SOME NUMBNESS TO HAND.  RG

TECHNIQUE

Sagittal axial images were obtained with variable T1 and T2 weighting.

COMPARISONS

None available

FINDINGS

Mild degenerative changes of the anterior vertebral endplates at are evident at T1-2. No
compression fractures are seen. There are no abnormal signal properties of the thoracic spinal cord.

There is mild disc bulging throughout the thoracic spine without central canal narrowing. The
neural foramina are generally well preserved with exception of moderate to severe narrowing on the
right at T11-T12 and mild left neural foraminal stenosis at T11-T12.

IMPRESSION

Moderate severe right neural foraminal stenosis at T11-T12 and mild left neural foraminal stenosis
at T11-T12. No compression fractures are seen.

Mild disc bulging throughout the thoracic spine.

Tech Notes:

WORSENING BACK PAIN WITH SOME NUMBNESS TO HAND.  RG

## 2023-05-02 ENCOUNTER — Encounter: Admit: 2023-05-02 | Discharge: 2023-05-02 | Payer: MEDICARE

## 2023-05-02 MED ORDER — AMITRIPTYLINE 10 MG PO TAB
10 mg | ORAL_TABLET | Freq: Every evening | ORAL | 3 refills
Start: 2023-05-02 — End: ?

## 2023-05-04 ENCOUNTER — Encounter: Admit: 2023-05-04 | Discharge: 2023-05-04 | Payer: MEDICARE

## 2023-05-04 DIAGNOSIS — Z951 Presence of aortocoronary bypass graft: Secondary | ICD-10-CM

## 2023-05-04 DIAGNOSIS — G4700 Insomnia, unspecified: Secondary | ICD-10-CM

## 2023-05-04 DIAGNOSIS — Z01810 Encounter for preprocedural cardiovascular examination: Secondary | ICD-10-CM

## 2023-05-04 DIAGNOSIS — E785 Hyperlipidemia, unspecified: Secondary | ICD-10-CM

## 2023-05-04 DIAGNOSIS — I5032 Chronic diastolic (congestive) heart failure: Secondary | ICD-10-CM

## 2023-05-04 DIAGNOSIS — R9439 Abnormal result of other cardiovascular function study: Secondary | ICD-10-CM

## 2023-05-04 DIAGNOSIS — E669 Obesity, unspecified: Secondary | ICD-10-CM

## 2023-05-04 DIAGNOSIS — I454 Nonspecific intraventricular block: Secondary | ICD-10-CM

## 2023-05-04 DIAGNOSIS — I2089 Stable angina pectoris (HCC): Secondary | ICD-10-CM

## 2023-05-04 DIAGNOSIS — E039 Hypothyroidism, unspecified: Secondary | ICD-10-CM

## 2023-05-04 DIAGNOSIS — M109 Gout, unspecified: Secondary | ICD-10-CM

## 2023-05-04 DIAGNOSIS — I2510 Atherosclerotic heart disease of native coronary artery without angina pectoris: Secondary | ICD-10-CM

## 2023-05-04 DIAGNOSIS — K219 Gastro-esophageal reflux disease without esophagitis: Secondary | ICD-10-CM

## 2023-05-04 DIAGNOSIS — R079 Chest pain, unspecified: Secondary | ICD-10-CM

## 2023-05-04 DIAGNOSIS — Z136 Encounter for screening for cardiovascular disorders: Secondary | ICD-10-CM

## 2023-05-04 MED ORDER — ROSUVASTATIN 10 MG PO TAB
10 mg | ORAL_TABLET | Freq: Every day | ORAL | 3 refills | 90.00000 days | Status: AC
Start: 2023-05-04 — End: ?

## 2023-05-04 NOTE — Progress Notes
Date of Service: 05/04/2023    Billy May is a 72 y.o. male.       HPI   Billy May is followed for coronary artery disease, hypertension and hypercholesterolemia.  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  He reports that he underwent back surgery on 04/15/2023 with laminectomy to L2-L5 without complications.  Billy May reports that his blood pressures been uniformly well controlled.  He states that his blood pressure is usually 120-130/60-70 mmHg when checked outside the hospital.  Otherwise, the patient has been doing well and reports no angina, congestive symptoms, palpitations, sensation of sustained forceful heart pounding, lightheadedness, falls or syncope.  His exercise tolerance has been stable, although he has been walking only short distances since undergoing back surgery.  He believes that he will be scheduled for physical therapy following recovery from back surgery.  The patient reports no myalgias, claudication, bleeding abnormalities, or strokelike symptoms.    Historically, on November 27, 2016 he underwent coronary artery bypass times 4 (left internal mammary artery anastomosed to the LAD, reverse saphenous vein graft anastomosed in sequence to the high diagonal and circumflex marginal artery and reverse saphenous vein graft anastomosed to the posterior descending artery).  He had Covid in December 2020.  He describes this as a mild case with cough and low-grade temperature and some muscle aches.  He was seen by a physician who performed an x-ray and said that he had bronchitis without pneumonia.  He was treated with prednisone.       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 Steward Ros  ANGIOGRAPHY:    The left main arises normally from the left coronary cusp and bifurcates into the LAD and the left circumflex artery.  The left main has about a 30% stenosis in its distal segment.    The LAD arises normally from the left main coronary artery.  The LAD is a type 3 vessel.  The LAD has diffuse 40% to 50% disease in its proximal, as well as in its mid section, and it is heavily calcified in the same area.  The LAD also has an 80% stenosis in its mid segment.  Distal to this, the LAD has about 20% to 30% stenosis in the mid section after the second diagonal branch.  The LAD gives off 3 diagonal branches.  The first of the 3  diagonal branches has about 50% to 60% stenosis in its proximal segment.     The circumflex artery arises normally from the left main coronary artery.  The left circumflex artery has 4-5 tandem lesions in its proximal to mid section, which are all varying from 70% to 90% stenosis.  It gives off an OM branch that does not appear to have any angiographic evidence of significant stenosis.  The RCA is a dominant vessel that arises normally from the right coronary cusp.  The RCA has about 50% stenosis in its ostial segment.  The mid to distal RCA is heavily calcified with diffuse disease of about 30% to 40%.  The PDA branch appears to have about 90% stenosis in its to distal segment.  The PLV branch is a small branch that does not appear to have any angiographic evidence of stenosis.  CONCLUSION:  Multivessel coronary artery disease as described above.      Abnormal thallium stress test 11/05/2016    Essential hypertension 09/22/2016    Family history of coronary artery disease 09/22/2016    Mixed dyslipidemia 09/08/2016    Observation for suspected cardiovascular disease 09/08/2016     01/28/2014  Exercise Stress exam   Conclusion:  No exercises induced chest pain, no ECG evidence of ischemia. PVC seen.  Hypertensive response to exercise  Medical thearpy with lifestyle modification.  Low risk study.      Chest pain 09/08/2016         Review of Systems   Constitutional: Negative.   HENT: Negative.     Eyes: Negative.    Cardiovascular: Negative.    Respiratory: Negative.     Endocrine: Negative.    Hematologic/Lymphatic: Negative.    Skin: Negative.    Musculoskeletal: Negative.    Gastrointestinal: Negative.    Genitourinary: Negative.    Neurological: Negative.    Psychiatric/Behavioral: Negative.     Allergic/Immunologic: Negative.        Physical Exam  GENERAL: The patient is well developed, well nourished, resting comfortably and in no distress.   HEENT: No abnormalities of the visible oro-nasopharynx, conjunctiva or sclera are noted.  NECK: There is no jugular venous distension. Carotids are palpable and without bruits. There is no thyroid enlargement.  Chest: Lung fields are clear to auscultation. There are no wheezes or crackles.   CV: There is a regular rhythm. The first and second heart sounds are normal.  A soft systolic ejection murmur is heard.  There are no diastolic murmurs, gallops or rubs.  His apical heart rate is 64 bpm.  ABD: The abdomen is soft and supple with normal bowel sounds. There is no hepatosplenomegaly, ascites, tenderness, masses or bruits.  Neuro: There are no focal motor defects. Ambulation is normal. Cognitive function appears normal.  Ext: There is no edema or evidence of deep vein thrombosis. Peripheral pulses are satisfactory.    SKIN: There are no rashes and no cellulitis.  His back incision looks good.  PSYCH: The patient is calm, rationale and oriented.    Cardiovascular Studies  A twelve-lead ECG obtained on 05/04/2023 reveals normal sinus rhythm with a heart rate of 63 bpm.  A nonspecific interventricular conduction delay is noted with QRS duration = 156 ms.  PR interval = 210 ms.  A prior ECG obtained on December 04, 2021 showed sinus bradycardia with a heart rate of 59 bpm and incomplete right bundle branch block with QRS duration = 96 ms.  Stress echo 12/01/2021:  Baseline:  There  is normal global and segmental left ventricular systolic function with estimated LVEF= 60%. There are no segmental wall motion abnormalities. Baseline EKG showed normal sinus rhythm with an incomplete right bundle branch block..     Stress:  The patient exercised for 6: 53 on a Bruce protocol reaching a peak heart rate of 112 bpm which was 74% maximum predicted heart rate.  The patient reported shortness of breath with stress.  The patient had no significant S-T changes or arrythmias.      Echocardiographic Findings:  All left ventricular wall segments became hyperdynamic with stress.     Conclusion:    There is no echocardiographic evidence of ischemia at this submaximal level of stress.      See remainder of report for additional findings.      Low risk for cardiovascular complications  Echocardiographic Findings    Left Ventricle The left ventricular size is normal. The left ventricular wall thickness is normal. Normal geometry. The left ventricular systolic function is normal. The visually estimated ejection fraction is 60%. There are no segmental wall motion abnormalities.   Right Ventricle The right ventricle is probably normal in size. The right ventricular systolic function is normal.   Left Atrium Normal size.   IVC/SVC Normal central venous pressure (0-5 mm Hg).   Mitral Valve Normal valve structure. No stenosis. Mild regurgitation. Tricuspid Valve Normal valve structure. No stenosis. Trace regurgitation.   Aortic Valve Normal valve structure. No stenosis. Mild regurgitation.   Pulmonary Normal valve structure. Trace regurgitation.   Pericardium No pericardial effusion.     Stress Details    ECG Baseline ECG indicates normal sinus rhythm. Baseline ECG: Incomplete right bundle branch block.   Arrhythmias during stress: rare PACs.   There were no arrhythmias during recovery.   Arrhythmias were not significant.    ECG was interpretable and conclusive.   Stress Findings A stress test was performed following the Bruce protocol, with the patient reaching stage 3.   The test was stopped because the patient complained of fatigue and dyspnea.   The patient reported shortness of breath during the stress test. Onset of symptoms occurred at stage 2 of the protocol. The patient experienced no angina during the stress test.   Blood pressure demonstrated a blunted response to stress. Heart rate demonstrated a blunted response to stress. Overall, the patient's exercise capacity was normal.   The patient's response to stress was inadequate for diagnosis.     Post-Stress Echo    Response to Stress Increased hyperdynamic ejection fraction response with stress.     Labs from 11/13/2022 revealed total cholesterol 114, triglycerides 105, HDL 36 and LDL cholesterol 57 mg/dL.  His serum creatinine was 1.16ALT = 27, hemoglobin A1c = 6.1 mg/dL.    Cardiovascular Health Factors  Vitals BP Readings from Last 3 Encounters:   05/04/23 (!) 149/82   09/01/22 (!) 150/76   12/04/21 (!) 150/80     Wt Readings from Last 3 Encounters:   05/04/23 93.6 kg (206 lb 6.4 oz)   09/01/22 92.1 kg (203 lb)   12/04/21 92.8 kg (204 lb 9.6 oz)     BMI Readings from Last 3 Encounters:   05/04/23 33.31 kg/m?   09/01/22 32.77 kg/m?   12/04/21 33.02 kg/m?      Smoking Social History     Tobacco Use   Smoking Status Never   Smokeless Tobacco Never      Lipid Profile Cholesterol   Date Value Ref Range Status  01/14/2022 101  Final     HDL   Date Value Ref Range Status   01/14/2022 26 (L) >=40 Final     LDL   Date Value Ref Range Status   01/14/2022 51  Final     Triglycerides   Date Value Ref Range Status   01/14/2022 124  Final      Blood Sugar Hemoglobin A1C   Date Value Ref Range Status   09/16/2020 5.6  Final     Glucose   Date Value Ref Range Status   10/29/2021 115  Final   09/02/2021 90  Final   09/16/2020 101  Final     Glucose, POC   Date Value Ref Range Status   12/02/2016 151 (H) 70 - 100 MG/DL Final   16/07/9603 540 (H) 70 - 100 MG/DL Final   98/08/9146 829 (H) 70 - 100 MG/DL Final          Problems Addressed Today  Encounter Diagnoses   Name Primary?    Screening for heart disease Yes    Preop cardiovascular exam     S/P CABG x 4     Insomnia, unspecified type     IVCD (intraventricular conduction defect)     Stable angina pectoris (HCC)     Chronic diastolic CHF (congestive heart failure), NYHA class 2 (HCC)        Assessment and Plan   Mr. Stueve reports that he is stable from a cardiovascular perspective and reports no chest discomfort or congestive symptoms.  He reports that his blood pressure is well-controlled when checked outside the hospital.  His last LDL cholesterol was 57 mg/dL.  Alternatives for the treatment of hypercholesterolemia were reviewed with the patient and he wanted to increase his rosuvastatin to 10 mg daily.  He was informed of possible adverse effects associated with an increase in statin therapy and asked to report any myalgias or other worrisome symptoms.  I have asked him to recheck his fasting lipid profile and ALT in 3 months.  He reports no heart failure symptoms but his ECG did change and now shows a nonspecific interventricular conduction delay.  I have asked him to obtain an echo Doppler study to make sure that that there are no significant structural cardiac abnormalities.  I have asked Mr. Posa to return for follow-up in 6 months time to review his progress. The total time spent during this interview and exam with preparation and chart review was 30 minutes.         Current Medications (including today's revisions)   allopurinol (ZYLOPRIM) 300 mg tablet Take one tablet by mouth daily. Take with food.    amitriptyline (ELAVIL) 10 mg tablet TAKE ONE TABLET BY MOUTH AT BEDTIME DAILY.    amLODIPine (NORVASC) 5 mg tablet Take one tablet by mouth twice daily. Change in dose.  Stop metoprolol. (Patient taking differently: Take two tablets by mouth twice daily. 1 tablet in the morning and 1/2 tablet in the evening)    aspirin EC 81 mg tablet Take one tablet by mouth at bedtime daily. Take with food.    gabapentin (NEURONTIN) 300 mg capsule Take one capsule by mouth daily.    levothyroxine (SYNTHROID) 50 mcg tablet Take one tablet by mouth daily 30 minutes before breakfast.    losartan (COZAAR) 100 mg tablet TAKE 1 TABLET BY MOUTH EVERY DAY    meloxicam (MOBIC) 15 mg tablet Take one tablet by mouth daily.    nitroglycerin (NITROSTAT) 0.4  mg tablet Place one tablet under tongue every 5 minutes as needed for Chest Pain. Max of 3 tablets, call 911.    pantoprazole DR (PROTONIX) 40 mg tablet Take one tablet by mouth daily.    rosuvastatin (CRESTOR) 10 mg tablet Take one tablet by mouth daily.    tamsulosin (FLOMAX) 0.4 mg capsule Take one capsule by mouth at bedtime daily.

## 2023-05-04 NOTE — Patient Instructions
Thank you for visiting our office today.    We would like to make the following medication adjustments:      Increase Crestor to 10mg  daily    FASTING labs in 3 months       Otherwise continue the same medications as you have been doing.          We will be pursuing the following tests after your appointment today:       Orders Placed This Encounter    LIPID PROFILE    ALT (SGPT)    ECG 12-LEAD    2D + DOPPLER ECHO    rosuvastatin (CRESTOR) 10 mg tablet         We will plan to see you back in 6 months.  Please call us in the meantime with any questions or concerns.        Please allow 5-7 business days for our providers to review your results. All normal results will go to MyChart. If you do not have Mychart, it is strongly recommended to get this so you can easily view all your results. If you do not have mychart, we will attempt to call you once with normal lab and testing results. If we cannot reach you by phone with normal results, we will send you a letter.  If you have not heard the results of your testing after one week please give [place] a call.       Your Cardiovascular Medicine [place] Team Brett Canales, Pilar Jarvis, Shawna Orleans, and [place])  phone number is [phone].

## 2023-05-14 IMAGING — CR [ID]
1 series · 1 of 1 positions shown · non-contrast
Comparison: none

[x chest ap]
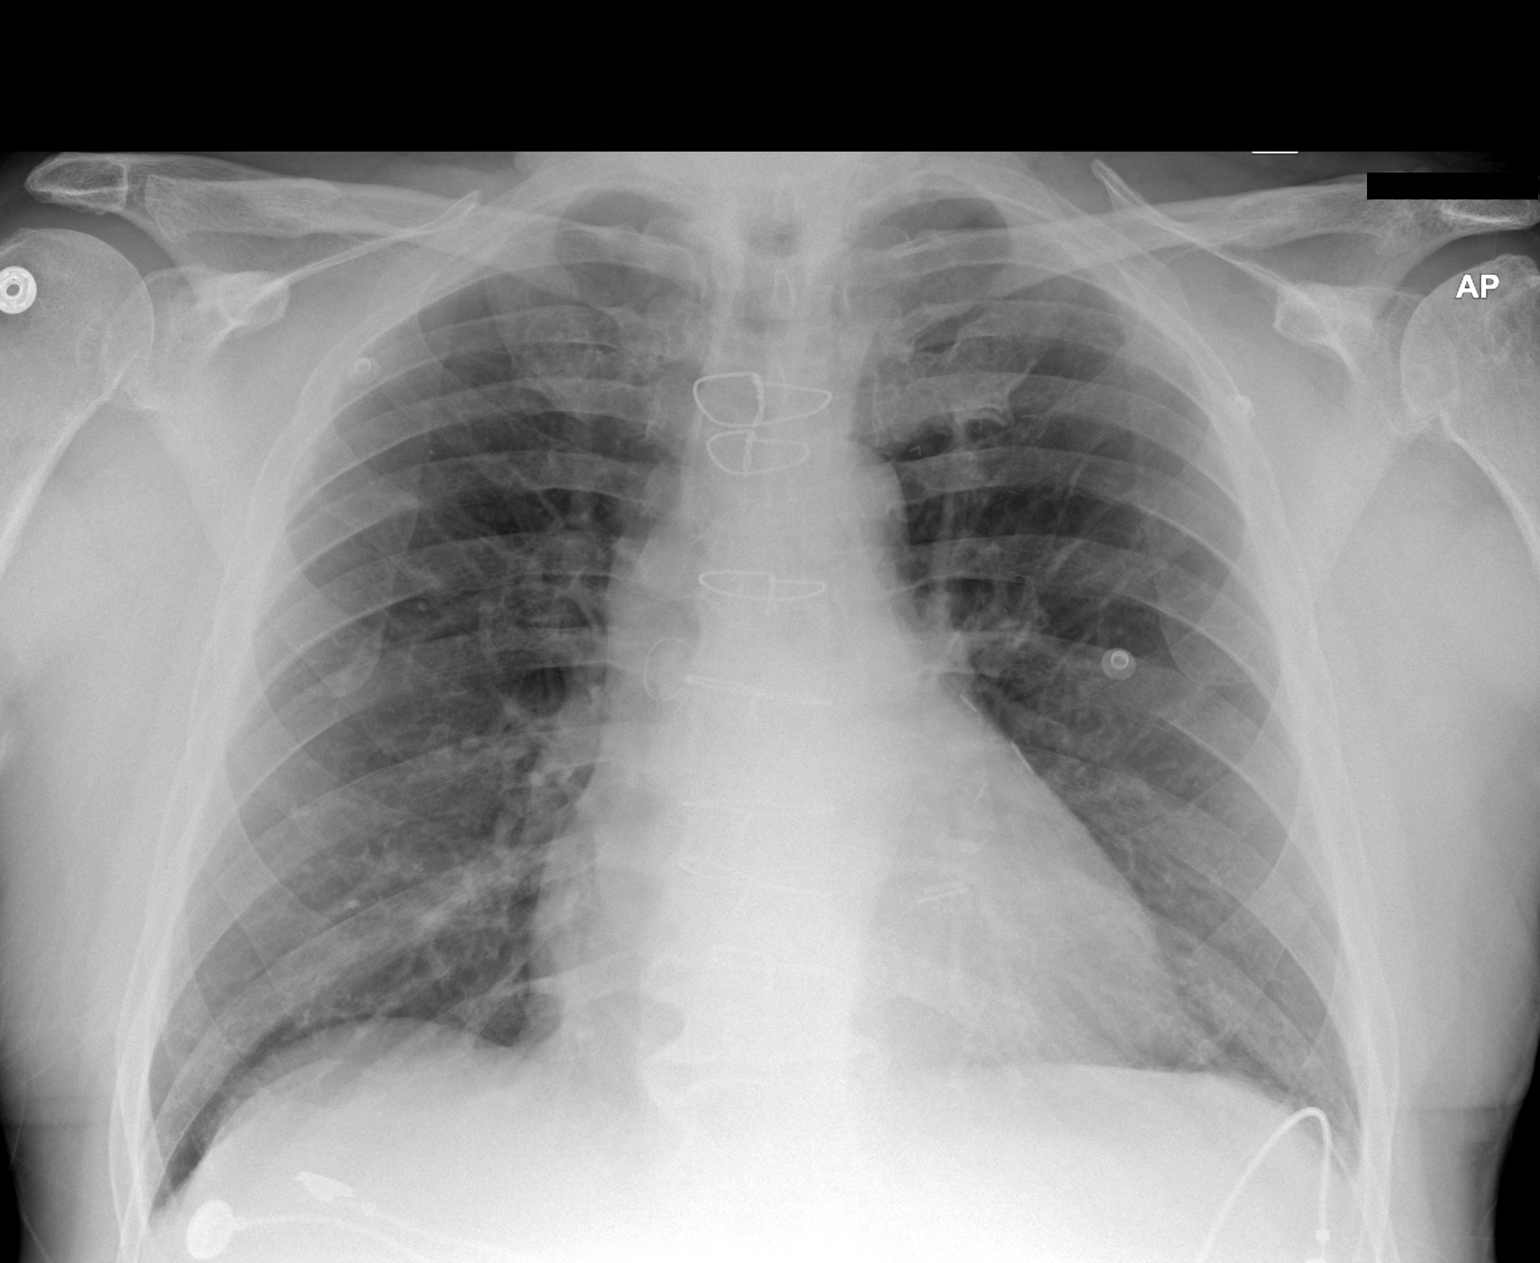

[1 of 1 positions shown; findings below may reference images not displayed]

DIAGNOSTIC STUDIES

EXAM

XR chest 1V

INDICATION

palpitations
PT C/O A FLUTTER IN HIS CHEST OVER THE PAST 2 WEEKS.  HX. QUADRUPLE BYPASS @ 5 YRS AGO.  TAKES MEDS
FOR HTN.  AB

TECHNIQUE

AP VIEW

COMPARISONS

10/09/2019

FINDINGS

The heart is normal. No focal consolidation, pleural effusion or pneumothorax. No aggressive
osseous lesions.

IMPRESSION

No acute pulmonary findings.

Tech Notes:

PT C/O A FLUTTER IN HIS CHEST OVER THE PAST 2 WEEKS.  HX. QUADRUPLE BYPASS @ 5 YRS AGO.  TAKES MEDS
FOR HTN.  AB

## 2023-05-17 ENCOUNTER — Encounter: Admit: 2023-05-17 | Discharge: 2023-05-17 | Payer: MEDICARE

## 2023-05-17 ENCOUNTER — Ambulatory Visit: Admit: 2023-05-17 | Discharge: 2023-05-17 | Payer: MEDICARE

## 2023-05-17 DIAGNOSIS — I454 Nonspecific intraventricular block: Secondary | ICD-10-CM

## 2023-05-17 DIAGNOSIS — Z136 Encounter for screening for cardiovascular disorders: Secondary | ICD-10-CM

## 2023-05-17 DIAGNOSIS — I5032 Chronic diastolic (congestive) heart failure: Secondary | ICD-10-CM

## 2023-05-17 DIAGNOSIS — Z01810 Encounter for preprocedural cardiovascular examination: Secondary | ICD-10-CM

## 2023-05-17 DIAGNOSIS — I2089 Stable angina pectoris (HCC): Secondary | ICD-10-CM

## 2023-05-17 DIAGNOSIS — Z951 Presence of aortocoronary bypass graft: Secondary | ICD-10-CM

## 2023-05-17 DIAGNOSIS — G4700 Insomnia, unspecified: Secondary | ICD-10-CM

## 2023-05-17 NOTE — Telephone Encounter
Left voicemail with results and recommendations. Left call back number for questions or concerns.

## 2023-05-17 NOTE — Telephone Encounter
-----   Message from Wesley Blas, MD sent at 05/17/2023 10:53 AM CDT -----  Cletis [place]: Mr. Haran had a new nonspecific interventricular conduction delay on his ECG when I saw him recently in clinic.  No serious structural abnormalities are apparent on his echo Doppler study.  The mild abnormalities can be followed over time and do not require any action.  Please let him know.  Thanks.  SBG

## 2023-07-14 ENCOUNTER — Encounter: Admit: 2023-07-14 | Discharge: 2023-07-14 | Payer: MEDICARE

## 2023-07-14 MED ORDER — AMITRIPTYLINE 10 MG PO TAB
10 mg | ORAL_TABLET | Freq: Every evening | ORAL | 0 refills
Start: 2023-07-14 — End: ?

## 2023-10-30 ENCOUNTER — Encounter: Admit: 2023-10-30 | Discharge: 2023-10-30 | Payer: MEDICARE

## 2023-11-02 ENCOUNTER — Encounter: Admit: 2023-11-02 | Discharge: 2023-11-02 | Payer: MEDICARE

## 2023-11-02 ENCOUNTER — Ambulatory Visit: Admit: 2023-11-02 | Discharge: 2023-11-02 | Payer: MEDICARE

## 2023-11-02 DIAGNOSIS — I5032 Chronic diastolic (congestive) heart failure: Secondary | ICD-10-CM

## 2023-11-02 NOTE — Patient Instructions
Call with medications    [phone] Call at 1pm I will be expecting your call    Thanks    Follow up as directed.  Call sooner if issues.  Call the [place] nursing line at [phone].  Leave a detailed message for the nurse in [place] [facility] with how we can assist you and we will call you back.

## 2023-11-02 NOTE — Progress Notes
Date of Service: 11/02/2023    Billy May is a 73 y.o. male.       HPI   Billy May is followed for coronary artery disease, hypertension and hypercholesterolemia.  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   Billy May reports that his blood pressures been generally well controlled when checked outside of the clinic.  However, I am not sure exactly how often he checks his blood pressure.  In addition, he is unsure which medications he is currently taking but indicates that he is following the instructions on the medication bottles.   Otherwise, the patient has been doing well and reports no angina, congestive symptoms, palpitations, sensation of sustained forceful heart pounding, lightheadedness, falls or syncope.  His exercise tolerance has been stable, although he does not currently have an exercise routine.  He does have a treadmill at home that he does not use.  The patient reports no myalgias, claudication, bleeding abnormalities, or strokelike symptoms.    Historically, on November 27, 2016 he underwent coronary artery bypass times 4 (left internal mammary artery anastomosed to the LAD, reverse saphenous vein graft anastomosed in sequence to the high diagonal and circumflex marginal artery and reverse saphenous vein graft anastomosed to the posterior descending artery).  He had Covid in December 2020.  He describes this as a mild case with cough and low-grade temperature and some muscle aches.  He was seen by a physician who performed an x-ray and said that he had bronchitis without pneumonia.  He was treated with prednisone. He reports that he underwent back surgery on 04/15/2023 with laminectomy to L2-L5 without complications.        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 Steward Ros  ANGIOGRAPHY:    The left main arises normally from the left coronary cusp and bifurcates into the LAD and the left circumflex artery.  The left main has about a 30% stenosis in its distal segment.    The LAD arises normally from the left main coronary artery.  The LAD is a type 3 vessel.  The LAD has diffuse 40% to 50% disease in its proximal, as well as in its mid section, and it is heavily calcified in the same area.  The LAD also has an 80% stenosis in its mid segment.  Distal to this, the LAD has about 20% to 30% stenosis in the  mid section after the second diagonal branch.  The LAD gives off 3 diagonal branches.  The first of the 3 diagonal branches has about 50% to 60% stenosis in its proximal segment.     The circumflex artery arises normally from the left main coronary artery.  The left circumflex artery has 4-5 tandem lesions in its proximal to mid section, which are all varying from 70% to 90% stenosis.  It gives off an OM branch that does not appear to have any angiographic evidence of significant stenosis.  The RCA is a dominant vessel that arises normally from the right coronary cusp.  The RCA has about 50% stenosis in its ostial segment.  The mid to distal RCA is heavily calcified with diffuse disease of about 30% to 40%.  The PDA branch appears to have about 90% stenosis in its to distal segment.  The PLV branch is a small branch that does not appear to have any angiographic evidence of stenosis.  CONCLUSION:  Multivessel coronary artery disease as described above.      Abnormal thallium stress test 11/05/2016    Essential hypertension 09/22/2016    Family history of coronary artery disease 09/22/2016    Mixed dyslipidemia 09/08/2016    Observation for suspected cardiovascular disease 09/08/2016     01/28/2014  Exercise Stress exam   Conclusion:  No exercises induced chest pain, no ECG evidence of ischemia. PVC seen.  Hypertensive response to exercise  Medical thearpy with lifestyle modification.  Low risk study.      Chest pain 09/08/2016         Review of Systems   Constitutional: Negative.   HENT: Negative.     Eyes: Negative.    Cardiovascular: Negative.    Respiratory: Negative.     Endocrine: Positive for cold intolerance.   Hematologic/Lymphatic: Negative.    Skin: Negative.    Musculoskeletal:  Positive for joint pain.   Gastrointestinal: Negative.    Genitourinary: Negative.    Neurological: Negative.    Psychiatric/Behavioral: Negative.     Allergic/Immunologic: Negative.        Physical Exam  GENERAL: The patient is well developed, well nourished, resting comfortably and in no distress.   HEENT: No abnormalities of the visible oro-nasopharynx, conjunctiva or sclera are noted.  NECK: There is no jugular venous distension. Carotids are palpable and without bruits. There is no thyroid enlargement.  Chest: Lung fields are clear to auscultation. There are no wheezes or crackles.   CV: There is a regular rhythm. The first and second heart sounds are normal.  A soft systolic ejection murmur is heard.  There are no diastolic murmurs, gallops or rubs.  His apical heart rate is 64 bpm.  ABD: The abdomen is soft and supple with normal bowel sounds. There is no hepatosplenomegaly, ascites, tenderness, masses or bruits.  Neuro: There are no focal motor defects. Ambulation is normal. Cognitive function appears normal.  Ext: There is no edema or evidence of deep vein thrombosis. Peripheral pulses are satisfactory.    SKIN: There are no rashes and no cellulitis.  His back incision looks good.  PSYCH: The patient is calm, rationale and oriented.    Cardiovascular Studies  A twelve-lead ECG obtained on 05/04/2023 reveals normal sinus rhythm with a heart rate of 63 bpm.  A nonspecific interventricular conduction delay is noted with QRS duration = 156 ms.  PR interval = 210 ms.  A prior ECG obtained on December 04, 2021 showed sinus bradycardia with  a heart rate of 59 bpm and incomplete right bundle branch block with QRS duration = 96 ms.    Echo Doppler 05/17/2023:  Interpretation Summary  No regional wall motion abnormalities are seen. Overall left ventricular systolic function appears normal. The estimated left ventricular ejection fraction is 60-65%.   Normal left ventricular diastolic function.   Right ventricular chamber dimensions and contractility appear normal.  Normal atrial chamber dimensions.  Aortic valve sclerosis with mild aortic valve regurgitation.  Mild mitral valve regurgitation and mild tricuspid valve regurgitation are noted by Doppler exam.  The aortic root and the visualized portions of the ascending aorta appear normal in size.  No pericardial effusion is seen.  The estimated peak systolic PA pressure = 47 mmHg.     The estimated peak systolic PA pressure was reported as 41 mmHg on the prior echocardiographic study performed on 12/01/2021.  Otherwise, there has been no significant change when compared to prior exam performed on 12/01/2021.    Stress echo 12/01/2021:  Baseline:  There is normal global and segmental left ventricular systolic function with estimated LVEF= 60%. There are no segmental wall motion abnormalities. Baseline EKG showed normal sinus rhythm with an incomplete right bundle branch block..     Stress:  The patient exercised for 6: 53 on a Bruce protocol reaching a peak heart rate of 112 bpm which was 74% maximum predicted heart rate.  The patient reported shortness of breath with stress.  The patient had no significant S-T changes or arrythmias.      Echocardiographic Findings:  All left ventricular wall segments became hyperdynamic with stress.     Conclusion:    There is no echocardiographic evidence of ischemia at this submaximal level of stress.      See remainder of report for additional findings.      Low risk for cardiovascular complications  Echocardiographic Findings     Left Ventricle The left ventricular size is normal. The left ventricular wall thickness is normal. Normal geometry. The left ventricular systolic function is normal. The visually estimated ejection fraction is 60%. There are no segmental wall motion abnormalities.   Right Ventricle The right ventricle is probably normal in size. The right ventricular systolic function is normal.   Left Atrium Normal size.   IVC/SVC Normal central venous pressure (0-5 mm Hg).   Mitral Valve Normal valve structure. No stenosis. Mild regurgitation.   Tricuspid Valve Normal valve structure. No stenosis. Trace regurgitation.   Aortic Valve Normal valve structure. No stenosis. Mild regurgitation.   Pulmonary Normal valve structure. Trace regurgitation.   Pericardium No pericardial effusion.      Stress Details     ECG Baseline ECG indicates normal sinus rhythm. Baseline ECG: Incomplete right bundle branch block.   Arrhythmias during stress: rare PACs.   There were no arrhythmias during recovery.   Arrhythmias were not significant.    ECG was interpretable and conclusive.   Stress Findings A stress test was performed following the Bruce protocol, with the patient reaching stage 3.   The test was stopped because the patient complained of fatigue and dyspnea.   The patient reported shortness of breath during the stress test. Onset of symptoms occurred at stage 2 of the protocol. The patient experienced no angina during the stress test.   Blood pressure demonstrated a blunted response to stress. Heart rate demonstrated a blunted response to stress. Overall, the patient's exercise capacity was normal.   The patient's response to stress was inadequate for  diagnosis.      Post-Stress Echo     Response to Stress Increased hyperdynamic ejection fraction response with stress.      Labs from 11/13/2022 revealed total cholesterol 114, triglycerides 105, HDL 36 and LDL cholesterol 57 mg/dL.  His serum creatinine was 1.16ALT = 27, hemoglobin A1c = 6.1 mg/dL.    Cardiovascular Health Factors  Vitals BP Readings from Last 3 Encounters:   11/02/23 136/79   05/17/23 137/84   05/04/23 (!) 149/82     Wt Readings from Last 3 Encounters:   11/02/23 93.9 kg (207 lb)   05/17/23 93.4 kg (206 lb)   05/04/23 93.6 kg (206 lb 6.4 oz)     BMI Readings from Last 3 Encounters:   11/02/23 34.45 kg/m?   05/17/23 33.27 kg/m?   05/04/23 33.31 kg/m?      Smoking Social History     Tobacco Use   Smoking Status Never   Smokeless Tobacco Never      Lipid Profile Cholesterol   Date Value Ref Range Status   11/10/2022 114 (L) 160 - 240 Final     HDL   Date Value Ref Range Status   11/10/2022 35.8  Final     LDL   Date Value Ref Range Status   11/10/2022 57 (L) 80 - 200 Final     Triglycerides   Date Value Ref Range Status   11/10/2022 105  Final      Blood Sugar Hemoglobin A1C   Date Value Ref Range Status   11/10/2022 6.1 (H) 4.6 - 5.9 Final     Glucose   Date Value Ref Range Status   11/10/2022 92  Final   10/29/2021 115  Final   09/02/2021 90  Final     Glucose, POC   Date Value Ref Range Status   12/02/2016 151 (H) 70 - 100 MG/DL Final   69/62/9528 413 (H) 70 - 100 MG/DL Final   24/40/1027 253 (H) 70 - 100 MG/DL Final          Problems Addressed Today  Coronary artery disease.  Hypertension.  Hypercholesterolemia.    Assessment and Plan   Mr. Hanif reports that he is stable from a cardiovascular perspective and reports no chest discomfort or congestive symptoms.  He believes that his blood pressure is well-controlled when checked outside the hospital.  However, he is unsure of his medications and I have asked him to verify his medications when he gets home and to call our office so that we have an updated accurate medication list.  His last LDL cholesterol was 57 mg/dL.  He reports that he is due to have his lipid panel checked through his primary care physician office within a short period of time.  His last echo from July 2024 showed normal left ventricular systolic and diastolic function.  Cardiovascular risk factor modification was reviewed in detail. Regular mild aerobic exercise, weight loss and adherence to a heart healthy diet were recommended.  I have asked Mr. Kapuscinski to return for follow-up in 6 months time to review his progress. The total time spent during this interview and exam with preparation and chart review was 30 minutes.         Current Medications (including today's revisions)   allopurinol (ZYLOPRIM) 300 mg tablet Take one tablet by mouth daily. Take with food.    amitriptyline (ELAVIL) 10 mg tablet TAKE ONE TABLET BY MOUTH AT BEDTIME DAILY.    amLODIPine (NORVASC) 5 mg tablet  Take one tablet by mouth twice daily. Change in dose.  Stop metoprolol. (Patient taking differently: Take two tablets by mouth twice daily. 1 tablet in the morning and 1/2 tablet in the evening)    aspirin EC 81 mg tablet Take one tablet by mouth at bedtime daily. Take with food.    gabapentin (NEURONTIN) 300 mg capsule Take one capsule by mouth daily.    levothyroxine (SYNTHROID) 50 mcg tablet Take one tablet by mouth daily 30 minutes before breakfast.    losartan (COZAAR) 100 mg tablet TAKE 1 TABLET BY MOUTH EVERY DAY    meloxicam (MOBIC) 15 mg tablet Take one tablet by mouth daily.    nitroglycerin (NITROSTAT) 0.4 mg tablet Place one tablet under tongue every 5 minutes as needed for Chest Pain. Max of 3 tablets, call 911.    pantoprazole DR (PROTONIX) 40 mg tablet Take one tablet by mouth daily.    rosuvastatin (CRESTOR) 10 mg tablet Take one tablet by mouth daily.    tamsulosin (FLOMAX) 0.4 mg capsule Take one capsule by mouth at bedtime daily.

## 2023-11-19 ENCOUNTER — Encounter: Admit: 2023-11-19 | Discharge: 2023-11-19 | Payer: MEDICARE

## 2023-11-19 MED ORDER — AMLODIPINE 5 MG PO TAB
5 mg | ORAL_TABLET | Freq: Two times a day (BID) | ORAL | 3 refills | Status: AC
Start: 2023-11-19 — End: ?

## 2024-01-15 ENCOUNTER — Encounter: Admit: 2024-01-15 | Discharge: 2024-01-15

## 2024-01-15 ENCOUNTER — Inpatient Hospital Stay: Admit: 2024-01-15 | Discharge: 2024-01-15

## 2024-01-15 DIAGNOSIS — R7989 Other specified abnormal findings of blood chemistry: Secondary | ICD-10-CM

## 2024-01-15 DIAGNOSIS — I249 Acute ischemic heart disease, unspecified: Secondary | ICD-10-CM

## 2024-01-15 DIAGNOSIS — Z0389 Encounter for observation for other suspected diseases and conditions ruled out: Secondary | ICD-10-CM

## 2024-01-15 LAB — 2D + DOPPLER ECHO
AORTIC VALVE STROKE VOLUME INDEX: 29
ASCENDING AORTA: 3.1 cm
AV INDEX (NATIVE): 0.6
AV PEAK GRADIENT: 6 mmHg
AV REGURGITATION PRESSURE HALF TIME: 530 ms
BSA: 2 m2
DOP CALC LVOT AREA: 3.4 cm2
DOP CALC LVOT DIAMETER: 2.1 cm
DOP CALC LVOT PEAK VEL VTI: 16 cm
DOP CALC LVOT PEAK VEL: 0.8 m/s
DOP CALC LVOT STROKE VOLUME: 58 cm3
E WAVE DECELARTION TIME: 262 ms
E/A RATIO: 0.5
ECHO EF: 50 %
EJECTION FRACTION: 56 %
FRACTIONAL SHORTENING: 33 % (ref 28–44)
INTERVENTRICULAR SEPTUM: 0.9 cm (ref 0.6–1)
LATERAL E/E' RATIO: 7
LEFT ATRIUM INDEX: 22 mL/m2 (ref 16–34)
LEFT ATRIUM SIZE: 4.1 cm (ref 3–4)
LEFT INTERNAL DIMENSION IN SYSTOLE: 3.6 cm (ref 2.5–4)
LEFT VENTRICLE DIASTOLIC VOLUME INDEX: 70 mL/m2 (ref 34–74)
LEFT VENTRICLE DIASTOLIC VOLUME: 144 mL (ref 62–150)
LEFT VENTRICLE MASS INDEX: 88 g/m2 (ref 49–115)
LEFT VENTRICLE SYSTOLIC VOLUME INDEX: 33 mL/m2 (ref 11–31)
LEFT VENTRICLE SYSTOLIC VOLUME: 68 mL (ref 21–61)
LEFT VENTRICULAR INTERNAL DIMENSION IN DIASTOLE: 5.4 cm (ref 4.2–5.8)
LEFT VENTRICULAR MASS: 180 g (ref 88–224)
MEDIAL E/E' RATIO: 8.1
MV PEAK A VEL: 0.8 m/s
MV PEAK E VEL PW: 0.4 m/s
POSTERIOR WALL: 0.9 cm (ref 0.6–1)
RA PRESSURE: 3
RELATIVE WALL THICKNESS: 0.3 (ref <=0.42)
RIGHT ATRIAL AREA: 21 cm2 (ref <18)
RIGHT HEART SYSTOLIC MMODE TAPSE: 1.3 cm (ref >1.7)
RIGHT HEART SYSTOLIC TDI S': 0 m/s
RIGHT VENTRICULAR BASAL DIAMETER: 3.3 cm (ref 2.5–4.1)
RIGHT VENTRICULAR MID DIAMETER: 3.5 cm (ref 1.9–3.5)
RV SYSTOLIC PRESSURE: 27
SIMPSONS BIPLANE EF: 53 %
TDI LATERAL E': 0 m/s
TDI MEDIAL E': 0 m/s
TR PEAK VELOCITY: 2.6 m/s
TV REST PULMONARY ARTERY PRESSURE: 30 mmHg

## 2024-01-15 LAB — ECG 12-LEAD
P AXIS: 55 degrees
QTC CALCULATION (BAZETT): 432 ms
R AXIS: 94 degrees
T AXIS: 41 degrees

## 2024-01-15 LAB — PTT (APTT): ~~LOC~~ BKR PTT: 128 s — ABNORMAL HIGH (ref 24.0–36.5)

## 2024-01-15 LAB — CBC AND DIFF
~~LOC~~ BKR ABSOLUTE BASO COUNT: 0 10*3/uL (ref 0.00–0.20)
~~LOC~~ BKR ABSOLUTE EOS COUNT: 0 10*3/uL (ref 0.00–0.45)
~~LOC~~ BKR ABSOLUTE LYMPH COUNT: 0.5 10*3/uL — ABNORMAL LOW (ref 1.00–4.80)
~~LOC~~ BKR ABSOLUTE MONO COUNT: 1 10*3/uL — ABNORMAL HIGH (ref 0.00–0.80)
~~LOC~~ BKR ABSOLUTE NEUTROPHIL: 11 10*3/uL — ABNORMAL HIGH (ref 1.80–7.00)
~~LOC~~ BKR MDW (MONOCYTE DISTRIBUTION WIDTH): 20 (ref <=20.6)
~~LOC~~ BKR MONOCYTES %: 7.3 % — ABNORMAL HIGH (ref 4.0–12.0)
~~LOC~~ BKR RBC COUNT: 4.3 10*6/uL — ABNORMAL LOW (ref 4.40–5.50)
~~LOC~~ BKR WBC COUNT: 13 10*3/uL — ABNORMAL HIGH (ref 4.50–11.00)

## 2024-01-15 LAB — NT-PRO-BNP: ~~LOC~~ BKR NT-PRO-BNP: 253 pg/mL — ABNORMAL HIGH (ref 40.0–<125)

## 2024-01-15 LAB — MAGNESIUM: ~~LOC~~ BKR MAGNESIUM: 1.9 mg/dL — ABNORMAL LOW (ref 1.6–2.6)

## 2024-01-15 LAB — TSH WITH FREE T4 REFLEX: ~~LOC~~ BKR TSH: 2 [IU]/mL — ABNORMAL HIGH (ref 0.35–5.00)

## 2024-01-15 LAB — COMPREHENSIVE METABOLIC PANEL
~~LOC~~ BKR ALBUMIN: 3.7 g/dL — ABNORMAL HIGH (ref 3.5–5.0)
~~LOC~~ BKR ALK PHOSPHATASE: 133 U/L — ABNORMAL HIGH (ref 25–110)
~~LOC~~ BKR ALT: 805 U/L — ABNORMAL HIGH (ref 7–56)
~~LOC~~ BKR CALCIUM: 8.7 mg/dL (ref 8.5–10.6)
~~LOC~~ BKR CO2: 20 mmol/L — ABNORMAL LOW (ref 21–30)
~~LOC~~ BKR TOTAL BILIRUBIN: 2.3 mg/dL — ABNORMAL HIGH (ref 0.2–1.3)
~~LOC~~ BKR TOTAL PROTEIN: 6.2 g/dL (ref 6.0–8.0)

## 2024-01-15 LAB — LACTIC ACID(LACTATE): ~~LOC~~ BKR LACTIC ACID: 1.3 mmol/L (ref 0.5–2.0)

## 2024-01-15 LAB — HIGH SENSITIVITY TROPONIN I, RANDOM: ~~LOC~~ BKR HIGH SENSITIVITY TROPONIN I: 17 ng/L — ABNORMAL HIGH (ref 26.0–<20)

## 2024-01-15 MED ORDER — ASPIRIN 81 MG PO TBEC
81 mg | Freq: Every evening | ORAL | 0 refills | Status: DC
Start: 2024-01-15 — End: 2024-01-19
  Administered 2024-01-16 – 2024-01-18 (×3): 81 mg via ORAL

## 2024-01-15 MED ORDER — NITROGLYCERIN 0.4 MG SL SUBL
.4 mg | SUBLINGUAL | 0 refills | Status: DC | PRN
Start: 2024-01-15 — End: 2024-01-19

## 2024-01-15 MED ORDER — GABAPENTIN 300 MG PO CAP
300 mg | Freq: Every evening | ORAL | 0 refills | Status: DC
Start: 2024-01-15 — End: 2024-01-19
  Administered 2024-01-16 – 2024-01-18 (×3): 300 mg via ORAL

## 2024-01-15 MED ORDER — ALBUTEROL SULFATE 90 MCG/ACTUATION IN HFAA
2 | RESPIRATORY_TRACT | 0 refills | Status: DC | PRN
Start: 2024-01-15 — End: 2024-01-19

## 2024-01-15 MED ORDER — AMITRIPTYLINE 10 MG PO TAB
10 mg | Freq: Every evening | ORAL | 0 refills | Status: DC
Start: 2024-01-15 — End: 2024-01-19
  Administered 2024-01-16 – 2024-01-18 (×3): 10 mg via ORAL

## 2024-01-15 MED ORDER — ROSUVASTATIN 10 MG PO TAB
10 mg | Freq: Every day | ORAL | 0 refills | Status: DC
Start: 2024-01-15 — End: 2024-01-19
  Administered 2024-01-16 – 2024-01-18 (×3): 10 mg via ORAL

## 2024-01-15 MED ORDER — LOSARTAN 50 MG PO TAB
100 mg | Freq: Every day | ORAL | 0 refills | Status: DC
Start: 2024-01-15 — End: 2024-01-19
  Administered 2024-01-16 – 2024-01-18 (×3): 100 mg via ORAL

## 2024-01-15 MED ORDER — SODIUM CHLORIDE 0.9 % IJ SOLN
10 mL | Freq: Once | INTRAVENOUS | 0 refills | Status: CP
Start: 2024-01-15 — End: ?

## 2024-01-15 MED ORDER — AMLODIPINE 5 MG PO TAB
5 mg | Freq: Two times a day (BID) | ORAL | 0 refills | Status: DC
Start: 2024-01-15 — End: 2024-01-19
  Administered 2024-01-16 – 2024-01-18 (×6): 5 mg via ORAL

## 2024-01-15 MED ORDER — GABAPENTIN 300 MG PO CAP
300 mg | Freq: Every day | ORAL | 0 refills | Status: DC
Start: 2024-01-15 — End: 2024-01-15

## 2024-01-15 MED ORDER — IOHEXOL 350 MG IODINE/ML IV SOLN
100 mL | Freq: Once | INTRAVENOUS | 0 refills | Status: CP
Start: 2024-01-15 — End: ?
  Administered 2024-01-15: 19:00:00 100 mL via INTRAVENOUS

## 2024-01-15 MED ORDER — PERFLUTREN LIPID MICROSPHERES 1.1 MG/ML IV SUSP
1-10 mL | Freq: Once | INTRAVENOUS | 0 refills | Status: CP | PRN
Start: 2024-01-15 — End: ?
  Administered 2024-01-15: 19:00:00 6 mL via INTRAVENOUS

## 2024-01-15 MED ORDER — SODIUM CHLORIDE 0.9 % IJ SOLN
50 mL | Freq: Once | INTRAVENOUS | 0 refills | Status: CP
Start: 2024-01-15 — End: ?
  Administered 2024-01-15: 19:00:00 50 mL via INTRAVENOUS

## 2024-01-15 MED ORDER — TAMSULOSIN 0.4 MG PO CAP
.4 mg | Freq: Every evening | ORAL | 0 refills | Status: DC
Start: 2024-01-15 — End: 2024-01-19
  Administered 2024-01-16 – 2024-01-18 (×3): 0.4 mg via ORAL

## 2024-01-15 MED ORDER — ENOXAPARIN 40 MG/0.4 ML SC SYRG
40 mg | Freq: Every day | SUBCUTANEOUS | 0 refills | Status: DC
Start: 2024-01-15 — End: 2024-01-19
  Administered 2024-01-16 – 2024-01-18 (×3): 40 mg via SUBCUTANEOUS

## 2024-01-15 MED ORDER — LEVOTHYROXINE 25 MCG PO TAB
50 ug | Freq: Every day | ORAL | 0 refills | Status: DC
Start: 2024-01-15 — End: 2024-01-19
  Administered 2024-01-16 – 2024-01-18 (×3): 50 ug via ORAL

## 2024-01-15 MED ORDER — MELOXICAM 15 MG PO TAB
15 mg | Freq: Every day | ORAL | 0 refills | Status: DC
Start: 2024-01-15 — End: 2024-01-18
  Administered 2024-01-16 – 2024-01-18 (×3): 15 mg via ORAL

## 2024-01-15 MED ORDER — PANTOPRAZOLE 40 MG PO TBEC
40 mg | Freq: Every day | ORAL | 0 refills | Status: DC
Start: 2024-01-15 — End: 2024-01-16
  Administered 2024-01-16: 13:00:00 40 mg via ORAL

## 2024-01-15 MED ORDER — SIMETHICONE 80 MG PO CHEW
80 mg | ORAL | 0 refills | Status: DC | PRN
Start: 2024-01-15 — End: 2024-01-19

## 2024-01-15 MED ORDER — ALLOPURINOL 300 MG PO TAB
300 mg | Freq: Every day | ORAL | 0 refills | Status: DC
Start: 2024-01-15 — End: 2024-01-19
  Administered 2024-01-16 – 2024-01-18 (×3): 300 mg via ORAL

## 2024-01-15 MED ADMIN — SODIUM CHLORIDE 0.9 % IJ SOLN [7319]: 10 mL | INTRAVENOUS | @ 19:00:00 | Stop: 2024-01-15 | NDC 00409488820

## 2024-01-15 NOTE — H&P (View-Only)
 Cardiology History and Physical      Patient's Name:  Billy May MRN: [id number]   Today's Date:  01/15/2024  Admission Date: 01/15/2024  LOS: 0 days    Problem list  Principal Problem:    Epigastric pain  Active Problems:    Mixed dyslipidemia    Essential hypertension    Coronary artery disease    GERD (gastroesophageal reflux disease)    S/P CABG x 4    LBBB (left bundle branch block)    Elevated d-dimer    Elevated LFTs    Elevated lipase      History of Present Illness:     Chief Complaint:  Pain in upper belly    Billy May is a 73 y.o. man w/ a PMHx of CAD s/p CABG 2018, diastolic CHF NYHA 2, HTN, obesity, GERD, HLD who was admitted on 01/15/2024 for chest pains with ACS concerns as a direct transfer to [facility] now undergoing evaluation for hepatitis and GI/pulm-related issues given less likelihood of ACS.    Billy May tells me that he had abdominal and back pain the night before.  He stated that his abdominal pain was largely in a bandlike pattern across his epigastric area extending to his right upper quadrant and left upper quadrant but denied radiation to chest or up to his shoulders. He also denied radiation to left jaw or left arm.  At the same time, he had back pain but is unsure if that is radiation or separate issue especially given his extensive history of spinal problems requiring decompression.  For his nonspecific lower chest/upper epigastric pain, he took a gas pill along with Protonix but it did not help with the discomfort.  He does have a history of GERD.  Per discussion, it appears that the patient has also noted some dyspnea within recent months. At its worst, the pain was an 8/10 and eventually he decided to go to his local ED in [place]. There, apparently based on EKG reads there was a concern for acute MI and therefore he was given aspirin and started on heparin before being transferred over to [facility].  By the time he arrived at [facility] his nonspecific pain had largely subsided. Follow-up evaluation based on EKG reads did not suggest that his EKG was showing acute MI but rather showed nonspecific, atypical changes including a left bundle branch block, prolonged PR interval, and right axis deviations which were similar to previously obtained EKGs.  Troponin at outside hospital was within normal range. Unfortunately, outside labs were pertinently positive for dramatically elevated liver enzymes with AST/ALT close to 1000.  Additionally, he had an elevated D-dimer above 2000 as well.  He denied any active history of acute hepatitis nor any known liver problems.  We discussed with the patient regarding liver problems were similar problems and extended family members that he was not sure of that either.  We discussed with Billy May that his presentation is most likely not due to a heart attack, but given some irregular findings in his laboratory values we will workup any other medical issues.  All questions were answered at this time.        Assessment and Plan:     Ziaire Hagos is a 73 y.o. man w/ a PMHx of CAD s/p CABG 2018, diastolic CHF NYHA 2, HTN, obesity, GERD, HLD who was admitted on 01/15/2024 for chest pains with ACS concerns as a direct transfer to [facility] now undergoing evaluation for hepatitis and  GI/pulm-related issues given less likelihood of ACS.    #Elevated liver enzymes, hepatitis  #Upper epigastric and abdominal pain  #Hx of reflux gastritis?  #Pancreatitis r/o  #GERD  - Abdominal pain PM of 3/28  - Band-like pattern in epigastrum with spread to RUQ and LUQ w/o radiation to chest or L arm/jaw  - Took protonix and gas pill without significant relief  - Reflux gastritis listed as a problem in outside documentation  - Outside AST 1000+, ALT 900+  - Admission labs: AST 937, ALT 805, ALP 133, Tbili 1.3  - Denied alcohol or severe Tylenol use, unsure of viral exposures  - Ddx acute hepatitis (infectious vs toxic vs NASH vs autoimmune, less likely shock) vs pancreatitis vs gallstone issue  PLAN:  >CT abd/pelv  > Tylenol level  > Acute hepatitis panel  > Lipase elevated to 379  > Lactate normal 1.3    #CAD s/p CABG 2018  #Concern for ACS initally, now less likely  #Diastolic CHF, NYHA 2  #HTN  #HLD  -  Left internal mammary artery anastomosed to the LAD, reverse saphenous vein graft anastomosed in sequence to the high diagonal and circumflex marginal artery and reverse saphenous vein graft anastomosed to the posterior descending artery  - Machine-read on OSH EKG stated acute MI and thus patient transferred to [facility]  - Given ASA and heparin at OSH  - Outside trops and admission trops both wnl  - EKG reviewed and showed atypical LBBB with PR prolongation and right axis deviation, but similar to previous EKG  - Ddx: low suspicion for ACS at this point  PLAN:  >Continue ASA 81 mg  > Hold heparin  > Continue PTA gabapentin  > Continue amlodipine 5 mg  > Continue rosuvastatin 10 mg  > Repeat lipid panel  > Check lipoprotein A    #Dyspnea  #PE rule out  - dyspnea on exertion per patient recently  - Shortness of breath with activity  - Elevated D-dimer to 2000+ at OSH  - Requiring low amounts of O2 on admission  - Lower suspicion for PE, though plan to rule out  PLAN:  > CTA ordered  > Wean O2 as able    #Leukocytosis  - WBC to 13.30  - No obvious infectious symptoms  PLAN:  > Monitor for now  > Check hepatitis panel    #Presumed gout  PLAN:  > Continue allopurinol    #Urinary obstruction, some sequelae  Plan:  > Continue PTA Flomax    Nutrition: No Dietician Consult  Wound: No Wound Consult    Diet: DIET CARDIAC(LOW FAT/LOW SODIUM)  VTE ppx: Enoxaparin  CODE: Full Code    DISPO: Admit to internal medicine.    Patient was discussed with Dr. Beather Arbour.    Patrick Jupiter, MD  Internal Medicine PGY-1  Available on Voalte      Subjective:     Review of Systems   Gastrointestinal:  Positive for abdominal pain.     Past medical history:  The patient  has a past medical history of Abnormal cardiovascular stress test, CAD (coronary artery disease), Chest pain (09/08/2016), GERD (gastroesophageal reflux disease), Gout, Hyperlipemia (09/08/2016), Hypothyroidism, and Obesity, Class I, BMI 30-34.9.    Social history:  - Tobacco: Denied  - Alcohol: sporadic  - Substances: denied  Social History     Tobacco Use    Smoking status: Never    Smokeless tobacco: Never   Substance Use Topics    Alcohol use: Yes  Comment: Occasional, 2 per month    Drug use: No       Past surgical history:  The patient  has a past surgical history that includes rotator cuff repair (Left); tonsillectomy; heart catheterization (2018); and coronary artery bypass graft (N/A, 11/27/2016).    Family History:  family history includes Chronic Lung Disease in his mother; Heart Attack in his maternal grandfather and mother; Heart Attack (age of onset: 41) in his brother; Heart Surgery in his brother; Stroke in his maternal grandmother.    Objective:   Vital Signs:  BP 130/58 (BP Source: Arm, Left Upper)  - Pulse 58  - Temp 37.6 ?C (99.6 ?F)  - Ht 170.2 cm (5' 7)  - Wt 88.8 kg (195 lb 12.8 oz)  - SpO2 98%  - BMI 30.67 kg/m?     Physical Exam  Constitutional:       Appearance: Normal appearance.   HENT:      Head: Normocephalic and atraumatic.   Eyes:      Conjunctiva/sclera: Conjunctivae normal.   Cardiovascular:      Rate and Rhythm: Normal rate and regular rhythm.      Pulses: Normal pulses.      Heart sounds: Normal heart sounds.   Pulmonary:      Effort: Pulmonary effort is normal.      Breath sounds: Normal breath sounds.   Abdominal:      General: Abdomen is flat. There is no distension.      Palpations: There is no mass.      Tenderness: There is no abdominal tenderness.   Musculoskeletal:         General: Normal range of motion.      Cervical back: Normal range of motion.      Right lower leg: No edema.      Left lower leg: No edema.   Skin:     Coloration: Skin is not jaundiced.   Neurological:      General: No focal deficit present.      Mental Status: He is alert and oriented to person, place, and time.   Psychiatric:         Mood and Affect: Mood normal.         Behavior: Behavior normal.           Labs:  Recent Labs     01/15/24  0733 01/15/24  0734   HGB 13.1*  --    HCT 40.4  --    WBC 13.30*  --    PLTCT 194  --    NA 138  --    K 3.9  --    CL 106  --    CO2 20*  --    BUN 27*  --    CR 1.18  --    GLU 115*  --    CA 8.7  --    MG 1.9  --    ALBUMIN 3.7  --    TOTPROT 6.2  --    TOTBILI 2.3*  --    AST 937*  --    ALT 805*  --    ALKPHOS 133*  --    LIPASE 379*  --    PTT  --  128.6*   Glucose: (!) 115 (01/15/24 1610)    Pertinent Radiology Reviewed.     Meds:  Scheduled Meds:allopurinoL (ZYLOPRIM) tablet 300 mg, 300 mg, Oral, QDAY  amitriptyline (ELAVIL) tablet 10 mg, 10 mg, Oral, QHS  amLODIPine (NORVASC) tablet 5 mg, 5 mg, Oral, BID  aspirin EC (ASPIR-LOW) tablet 81 mg, 81 mg, Oral, QHS  enoxaparin (LOVENOX) syringe 40 mg, 40 mg, Subcutaneous, QDAY(21)  gabapentin (NEURONTIN) capsule 300 mg, 300 mg, Oral, QDAY  levothyroxine (SYNTHROID) tablet 50 mcg, 50 mcg, Oral, QDAY 30 min before breakfast  losartan (COZAAR) tablet 100 mg, 100 mg, Oral, QDAY  meloxicam (MOBIC) tablet 15 mg, 15 mg, Oral, QDAY  pantoprazole DR (PROTONIX) tablet 40 mg, 40 mg, Oral, QDAY  rosuvastatin (CRESTOR) tablet 10 mg, 10 mg, Oral, QDAY  tamsulosin (FLOMAX) capsule 0.4 mg, 0.4 mg, Oral, QHS    Continuous Infusions:  PRN and Respiratory Meds:nitroglycerin Q5 MIN PRN

## 2024-01-15 NOTE — H&P (View-Only)
 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  His troponins x2 have been negative thus far.    Impressions:      Epigastric pain    Coronary artery disease    LBBB (left bundle branch block)    Mixed dyslipidemia    Essential hypertension    GERD (gastroesophageal reflux disease)    S/P CABG x 4    Elevated d-dimer    Elevated LFTs    Elevated lipase      His symptoms are epigastric pain and his cardiovascular examination is unremarkable, but he has notable findings of very high liver transaminases, elevated alk phos, minimally elevated Tbili and mildly elevated lipase.  He also had elevated d-dimer at the OSH, but is not having respiratory symptoms so the suspicion for PE is not necessarily high.    At this point, we do not have any evidence of MI and our suspicion for ACS is low given the history, findings and other non-cardiac abnormalities.     No indication for coronary angiography and will workup the severely elevated LFTs (?viral hepatitis, gallstone pancreatitis? Etc).  He does not abuse alcohol.  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  No viral hepatitis history or risk factors.    CT abdomen would be appropriate and given the other findings, with get CTA chest at same time to exclude PE.    We discussed his issues, symptoms, findings and our mgmt plan in detail with the pt and his family.  All questions answered.

## 2024-01-15 NOTE — Progress Notes
 RT Adult Assessment Note    NAME:Billy May             MRN: [id number]             DOB:May 26, 1951          AGE: 73 y.o.  ADMISSION DATE: 01/15/2024             DAYS ADMITTED: LOS: 0 days    Additional Comments:  Impressions of the patient: Pt resting in bed, no signs of acute respiratory distress at time of evaluation.   Intervention(s)/outcome(s): Evaluation. Pt reports asthma hx.   Patient education that was completed: None.   Recommendations to the care team: None at this time.     Vital Signs:  Pulse: 59  RR: 16 PER MINUTE  SpO2: 97 %  O2 Device: None (Room air)  Liter Flow:    O2%:      Breath Sounds:   All Breath Sounds: Clear (Implies normal)  Respiratory Effort:   Respiratory Effort/Pattern: Unlabored  Comments:

## 2024-01-16 LAB — BILIRUBIN, DIRECT: ~~LOC~~ BKR DIRECT BILIRUBIN: 2.2 mg/dL — ABNORMAL HIGH (ref <0.4)

## 2024-01-16 MED ORDER — ALUM-MAG HYDROXIDE-SIMETH 200-200-20 MG/5 ML PO SUSP
30 mL | Freq: Three times a day (TID) | ORAL | 0 refills | Status: DC | PRN
Start: 2024-01-16 — End: 2024-01-19
  Administered 2024-01-16 – 2024-01-17 (×2): 30 mL via ORAL

## 2024-01-16 MED ORDER — POTASSIUM CHLORIDE 20 MEQ PO TBTQ
40 meq | Freq: Once | ORAL | 0 refills | Status: CP
Start: 2024-01-16 — End: ?
  Administered 2024-01-16: 11:00:00 40 meq via ORAL

## 2024-01-16 MED ORDER — LIDOCAINE HCL 2 % MM SOLN
15 mL | Freq: Three times a day (TID) | ORAL | 0 refills | Status: DC | PRN
Start: 2024-01-16 — End: 2024-01-19
  Administered 2024-01-16 – 2024-01-17 (×2): 15 mL via ORAL

## 2024-01-16 MED ORDER — PANTOPRAZOLE 40 MG PO TBEC
40 mg | Freq: Two times a day (BID) | ORAL | 0 refills | Status: DC
Start: 2024-01-16 — End: 2024-01-19
  Administered 2024-01-17 – 2024-01-18 (×4): 40 mg via ORAL

## 2024-01-16 MED ORDER — MAGNESIUM SULFATE IN D5W 1 GRAM/100 ML IV PGBK
1 g | INTRAVENOUS | 0 refills | Status: CP
Start: 2024-01-16 — End: ?
  Administered 2024-01-16: 13:00:00 1 g via INTRAVENOUS

## 2024-01-16 NOTE — Progress Notes
 Cardiology Daily Progress Note      Patient's Name:  Billy May MRN: [id number]   Today's Date:  01/16/2024  Admission Date: 01/15/2024  LOS: 1 day    Problem list  Principal Problem:    Epigastric pain  Active Problems:    Mixed dyslipidemia    Essential hypertension    Coronary artery disease    GERD (gastroesophageal reflux disease)    S/P CABG x 4    LBBB (left bundle branch block)    Elevated d-dimer    Elevated LFTs    Elevated lipase      Assessment and Plan:     Billy May is a 73 y.o. man with a PMH of CAD s/p CABG 2018, diastolic CHF NYHA 2, HTN, obesity, GERD, HLD who was admitted on 01/15/2024 for chest pains with ACS concerns as a direct transfer to [facility] now undergoing evaluation for hepatitis and GI/pulm-related issues given less likelihood of ACS.     01/16/2024 Updates:  > CTA: 1. No pulmonary embolism. No consolidation, pleural effusion, or pneumothorax. 2. Mildly patulous, partially fluid-filled esophagus suggestive of gastroesophageal reflux and/or esophageal dysmotility. A follow-up esophagram could be obtained for further evaluation.  > CT c/a/p: Cholelithiasis without cholecystitis, choledocholithiasis, or biliary ductal dilation.  > Tylenol level: wnl  > A1c 5.9  > Iron: elevated ferritin 700 - likely reactive  > Lipid profile: cholesterol 64, Trigs 68, HDL 22, LDL 37 => likely diminished in setting of liver issue  > Check lipoprotein A - pending  > Plan to transfer to general internal medicine service given lack of acute cardiac issues to address  > Consider GI/hepatology consultation going forward    #Elevated liver enzymes, hepatitis  #Upper epigastric and abdominal pain  #Hx of reflux gastritis?  #Pancreatitis r/o  #GERD  - Abdominal pain PM of 3/28  - Band-like pattern in epigastrum with spread to RUQ and LUQ w/o radiation to chest or L arm/jaw  - Took protonix and gas pill without significant relief  - Reflux gastritis listed as a problem in outside documentation  - Outside AST 1000+, ALT 900+  - Admission labs: AST 937, ALT 805, ALP 133, Tbili 1.3  - Denied alcohol or severe Tylenol use, unsure of viral exposures  - Ddx acute hepatitis (infectious vs toxic vs NASH vs autoimmune, less likely shock) vs pancreatitis vs gallstone issue  - Lipase elevated to 379  - Lactate normal 1.3  PLAN:  > CT c/a/p: Cholelithiasis without cholecystitis, choledocholithiasis, or biliary ductal dilation.  > Tylenol level: wnl  > Acute hepatitis panel: negative  > Plan to transfer to general internal medicine service given lack of acute cardiac issues to address     #CAD s/p CABG 2018  #Concern for ACS initally, now less likely  #Diastolic CHF, NYHA 2  #HTN  #HLD  -  Left internal mammary artery anastomosed to the LAD, reverse saphenous vein graft anastomosed in sequence to the high diagonal and circumflex marginal artery and reverse saphenous vein graft anastomosed to the posterior descending artery  - Machine-read on OSH EKG stated acute MI and thus patient transferred to [facility]  - Given ASA and heparin at OSH  - Outside trops and admission trops both wnl  - EKG reviewed and showed atypical LBBB with PR prolongation and right axis deviation, but similar to previous EKG  - Ddx: low suspicion for ACS at this point  PLAN:  > Continue ASA 81 mg  > Hold heparin  >  Continue PTA gabapentin  > Continue amlodipine 5 mg  > Continue rosuvastatin 10 mg  > Lipid profile: cholesterol 64, Trigs 68, HDL 22, LDL 37 => likely diminished in setting of liver issue  > Check lipoprotein A - pending  > Plan to transfer to general internal medicine service given lack of acute cardiac issues to address     #Dyspnea  #PE rule out  - dyspnea on exertion per patient recently  - Shortness of breath with activity  - Elevated D-dimer to 2000+ at OSH  - Requiring low amounts of O2 on admission  - Lower suspicion for PE, though plan to rule out  PLAN:  > CTA: 1. No pulmonary embolism. No consolidation, pleural effusion, or pneumothorax. 2. Mildly patulous, partially fluid-filled esophagus suggestive of gastroesophageal reflux and/or esophageal dysmotility. A follow-up esophagram could be obtained for further evaluation.  > Wean O2 as able     #Leukocytosis  - WBC to 13.30  - No obvious infectious symptoms  PLAN:  > Monitor for now  > Check hepatitis panel     #Presumed gout  PLAN:  > Continue allopurinol     #Urinary obstruction, some sequelae  Plan:  > Continue PTA Flomax      Diet: DIET CARDIAC(LOW FAT/LOW SODIUM)  VTE ppx: Enoxaparin  PT/OT: consulted; recommendation: pending  CODE: Full Code    DISPO: transfer to general internal medicine service    Seen and discussed with Dr. Voncile Gu.    Vanessa General, MD  Internal Medicine PGY-1  Available on Voalte      Subjective:     Had some issues with abdominal pain after a larger meal with meatloaf and mashed potatoes yesterday afternoon. Pain within epigastric region similar to during admission to OSH but less severe. Talked further regarding GI history and he did note times where he has had darker urine with pale stools. We had an extensive discussion regarding explanations of his GI issues including possibly two separate issues: one within the esophagus and another within his biliary/liver/pancreas. We discussed that given him not having acute cardiac issues, we recommend working up his issues further while inpatient after transfer to an inpatient internal medicine service. Billy May was agreeable to this.    Objective:   Vital Signs:  BP 131/68 (BP Source: Arm, Left Upper)  - Pulse 65  - Temp 36.8 ?C (98.2 ?F)  - Ht 170.2 cm (5' 7)  - Wt 89.1 kg (196 lb 6.4 oz)  - SpO2 97%  - BMI 30.76 kg/m?     Intake/Output Summary:  (Last 24 hours)    Intake/Output Summary (Last 24 hours) at 01/16/2024 1253  Last data filed at 01/16/2024 1200  Gross per 24 hour   Intake 2030 ml   Output 2475 ml   Net -445 ml           Physical Exam  Constitutional:       Appearance: Normal appearance.   HENT:      Head: Normocephalic.   Eyes:      Conjunctiva/sclera: Conjunctivae normal.   Cardiovascular:      Rate and Rhythm: Normal rate and regular rhythm.      Pulses: Normal pulses.      Heart sounds: Normal heart sounds.   Pulmonary:      Effort: Pulmonary effort is normal.      Breath sounds: Normal breath sounds.   Abdominal:      General: Abdomen is flat.  Tenderness: There is abdominal tenderness.   Musculoskeletal:         General: Normal range of motion.      Cervical back: Normal range of motion.   Neurological:      General: No focal deficit present.      Mental Status: He is alert and oriented to person, place, and time.   Psychiatric:         Mood and Affect: Mood normal.         Behavior: Behavior normal.           Labs:  Recent Labs     01/15/24  0733 01/16/24  0327   HGB 13.1* 12.4*   WBC 13.30* 8.90   PLTCT 194 159   NA 138 134*   K 3.9 3.7   CL 106 107   CO2 20* 20*   BUN 27* 18   CR 1.18 1.08   GLU 115* 89   CA 8.7 7.6*   MG 1.9 1.8   ALBUMIN 3.7 3.2*   AST 937* 301*   ALT 805* 485*   ALKPHOS 133* 137*   TOTBILI 2.3* 3.5*       Meds:  Scheduled Meds:allopurinoL (ZYLOPRIM) tablet 300 mg, 300 mg, Oral, QDAY  amitriptyline (ELAVIL) tablet 10 mg, 10 mg, Oral, QHS  amLODIPine (NORVASC) tablet 5 mg, 5 mg, Oral, BID  aspirin EC (ASPIR-LOW) tablet 81 mg, 81 mg, Oral, QHS  enoxaparin (LOVENOX) syringe 40 mg, 40 mg, Subcutaneous, QDAY(21)  gabapentin (NEURONTIN) capsule 300 mg, 300 mg, Oral, QHS  levothyroxine (SYNTHROID) tablet 50 mcg, 50 mcg, Oral, QDAY 30 min before breakfast  losartan (COZAAR) tablet 100 mg, 100 mg, Oral, QDAY  meloxicam (MOBIC) tablet 15 mg, 15 mg, Oral, QDAY  pantoprazole DR (PROTONIX) tablet 40 mg, 40 mg, Oral, QDAY  rosuvastatin (CRESTOR) tablet 10 mg, 10 mg, Oral, QDAY  tamsulosin (FLOMAX) capsule 0.4 mg, 0.4 mg, Oral, QHS    Continuous Infusions:  PRN and Respiratory Meds:albuterol sulfate Q4H PRN, nitroglycerin Q5 MIN PRN, simethicone Q6H PRN      Wounds:  Active Wounds Nutrition:  Malnutrition Details:

## 2024-01-16 NOTE — Progress Notes
 CV/CCU Attending Brief Progress Note - seen and examined on rounds with Housestaff     He had recurrence of the band-like epigastric pain yesterday after having a sizeable meal.  His transaminases have declined, but his bilirubin has risen.    His abdominal imaging shows gallstones, dilated GB wall, fatty infiltration of the pancreas, and a patulous esophagus consistent with GE reflux and/or dysmotility.    His clinical situation clearly points toward abdominal pathology, probably with both liver/biliary tract dysfunction and esophageal pathology.  He clearly needs further workup and treatment of this.      With downtrending transaminases, I don't think his statin needs to be DCd, but his lipids being very very low are likely related to his acute hepatobiliary issues rather than a true representation of his dyslipidemia.    He has abdominal aortic and renal artery calcific atherosclerosis but no evidence of actionable disease or significant renal artery stenosis.    We've requested transfer to IM.  Long discussion with pt and wife (by phone) about the imaging findings, our impressions, and recommendations.  All questions answered.

## 2024-01-16 NOTE — Consults
 General Medicine Initial Consult Note    Name: Billy May        MRN: [id number]          DOB: Sep 23, 1951            Age: 73 y.o.  Admission Date: 01/15/2024       LOS: 1 day    Date of Service: 01/16/2024    Reason for Consult:  Patient transferred to CV at [facility] with concern for ACS but found to have elevated LFTs and concern for gallstones. Requesting change of service to internal medicine given he has no acute cardiac conditions.    Consult type: Request for transfer of care with direct verbal contact    Assessment  Billy May is a 73 yo M with  PMH of CAD s/p CABG 2018, diastolic CHF NYHA 2, HTN, obesity, GERD, HLD who was admitted on 01/15/2024 for chest pains with ACS concerns as a direct transfer to [facility]. He was admitted to Cardiology service for ACS rule out. Work-up was unrevealing for cardiac cause, suspect chest pain is GI in nature. He is stable to be transferred to Internal Medicine for further work-up.     Elevated liver enzymes  Hyperbilirubinemia  Epigastric pain   GERD  -OSH records: AST > 1000, ALT > 900  -Labs on [place] admission: AST 937, ALT 805, ALP 133, Tbili 1.3  -Intermittent alcohol use, denies heavy use since teenage years, denies Tylenol use  -Acute hepatitis panel negative  -CTA C/A/P w/ 3/29: CHEST: 1. No pulmonary embolism. No consolidation, pleural effusion, or   pneumothorax. 2. Mildly patulous, partially fluid-filled esophagus suggestive of gastroesophageal reflux and/or esophageal dysmotility. A follow-up esophagram could be obtained for further evaluation.   ABDOMEN AND PELVIS: Cholelithiasis without cholecystitis, choledocholithiasis, or biliary ductal dilation.   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, serial troponins were negative, suspect non-cardiac cause of chest pain likely GI as above   -2D echo 3/29: LVEF 50-55%, abnormal septal motion 2/2 LBBB and postoperative state, grade 1 diastolic dysfunction, normal RV size and borderline systolic function, mild RA dilatation, normal LA size, mild aortic sclerosis without stenosis, mild AR, CVP 0-5 mmHg, PASP 30 mmHg   -Lipid profile: LDL 37   -Continue PTA ASA 81 mg daily, amlodipine 5 mg daily, losartan 100 mg daily, rosuvastatin 10 mg daily     Hypothyroidism  -Continue PTA synthroid     BPH  -Continue PTA tamsulosin 0.4 mg daily     Recommendations:  -Patient will be transferred to Internal Medicine Service   -Hepatology consult placed for work-up of elevated LFTs, appreciate recs   -Increase PPI to BID, add GI cocktail TID PRN     Ellwood Sayers, MD  Hospitalist  Internal Medicine    Voalte is the preferred method of communication.   Please use the Med Private First Call for all patient-related communications. Personal Voaltes and pagers are not answered at all hours.    Discussed with cardiology team.   ______________________________________________________________________    Chief Complaint: Upper abdominal pain     History of Present Illness: Billy May is a 73 y.o. male with  PMH of CAD s/p CABG 2018, diastolic CHF NYHA 2, HTN, obesity, GERD, HLD who was admitted on 01/15/2024 for chest pains with ACS concerns  as a direct transfer to [facility]. He was admitted to Cardiology service for ACS rule out. Work-up was unrevealing for cardiac cause, suspect chest pain is GI in nature. He is stable to be transferred to Internal Medicine for further work-up.     He was initially admitted to Cardiology for concern for ACS. He reported epigastric pain, radiating to the back. He also reports long-standing issues with acid reflux. He has been on Protonix for about 10 years but it recently has not been working. He has never had an upper endoscopy. He has noticed some early satiety. He has noticed intermittent pale stools, last occurring maybe a week ago. He has never been diagnosed with liver issues in the past. He drinks alcohol intermittently, denies heavy use. He did binge drink in his teenage years but not into adulthood.     Past Medical History:    Abnormal cardiovascular stress test    CAD (coronary artery disease)    Chest pain    GERD (gastroesophageal reflux disease)    Gout    Hyperlipemia    Hypothyroidism    Obesity, Class I, BMI 30-34.9     Surgical History:   Procedure Laterality Date    HX HEART CATHETERIZATION  2018    ANGIOGRAPHY CORONARY ARTERY WITH LEFT HEART CATHETERIZATION WITH VENTRICULOGRAPHY Left 11/19/2016    Performed by Nat Math, MD at [facility] CATH LAB    POSSIBLE PERCUTANEOUS CORONARY STENT PLACEMENT WITH ANGIOPLASTY N/A 11/19/2016    Performed by Nat Math, MD at [facility] CATH LAB    BYPASS GRAFT CORONARY ARTERY x 4, Harvest of left saphenous vein and left internal mammary artery N/A 11/27/2016    Performed by Collene Schlichter, MD at [facility] CVOR    HX TONSILLECTOMY      ROTATOR CUFF REPAIR Left      Social History     Tobacco Use    Smoking status: Never    Smokeless tobacco: Never   Substance and Sexual Activity    Alcohol use: Yes     Comment: Occasional, 2 per month    Drug use: No           Family History   Problem Relation Name Age of Onset    Heart Attack Mother      Chronic Lung Disease Mother      Heart Surgery Brother      Heart Attack Brother  32    Stroke Maternal Grandmother      Heart Attack Maternal Grandfather         Allergies:  Atorvastatin and Sulfa (sulfonamide antibiotics)    Scheduled Meds:allopurinoL (ZYLOPRIM) tablet 300 mg, 300 mg, Oral, QDAY  amitriptyline (ELAVIL) tablet 10 mg, 10 mg, Oral, QHS  amLODIPine (NORVASC) tablet 5 mg, 5 mg, Oral, BID  aspirin EC (ASPIR-LOW) tablet 81 mg, 81 mg, Oral, QHS  enoxaparin (LOVENOX) syringe 40 mg, 40 mg, Subcutaneous, QDAY(21)  gabapentin (NEURONTIN) capsule 300 mg, 300 mg, Oral, QHS  levothyroxine (SYNTHROID) tablet 50 mcg, 50 mcg, Oral, QDAY 30 min before breakfast  losartan (COZAAR) tablet 100 mg, 100 mg, Oral, QDAY  magnesium sulfate   1 g/D5W 100 mL IVPB, 1 g, Intravenous, Q4H  meloxicam (MOBIC) tablet 15 mg, 15 mg, Oral, QDAY  pantoprazole DR (PROTONIX) tablet 40 mg, 40 mg, Oral, QDAY  rosuvastatin (CRESTOR) tablet 10 mg, 10 mg, Oral, QDAY  tamsulosin (FLOMAX) capsule 0.4 mg, 0.4 mg, Oral, QHS    Continuous Infusions:  PRN and Respiratory  Meds:albuterol sulfate Q4H PRN, nitroglycerin Q5 MIN PRN, simethicone Q6H PRN    Review of Systems:  A ROS was performed and noted in the HPI.  All other systems negative.    Vital Signs:  Last Filed in 24 hours Vital Signs:  24 hour Range    BP: 117/69 (03/30 0800)  Temp: 36.9 ?C (98.5 ?F) (03/30 0800)  Pulse: 76 (03/30 0800)  Respirations: 14 PER MINUTE (03/30 0400)  SpO2: 97 % (03/30 0800)  O2 Device: None (Room air) (03/30 0800)  O2 Liter Flow: 3 Lpm (03/29 1200) BP: (108-130)/(54-71)   Temp:  [36.9 ?C (98.5 ?F)-37.6 ?C (99.6 ?F)]   Pulse:  [56-76]   Respirations:  [11 PER MINUTE-20 PER MINUTE]   SpO2:  [95 %-98 %]   O2 Device: None (Room air)  O2 Liter Flow: 3 Lpm     Physical Exam:  General:  Alert, cooperative, no distress, appears stated age  Head:  Normocephalic, without obvious abnormality, atraumatic  Eyes:  CC, anicteric, EOMI  Lungs:  Clear to auscultation bilaterally  Heart:    Regular rate and rhythm, S1, S2 normal, no murmur, click rub or gallop  Abdomen:  Soft, non-tender.  Bowel sounds normal.  No masses.  No organomegaly.  Extremities:  Extremities normal, atraumatic, no cyanosis or edema  Skin:   Skin color, texture, turgor normal.  No rashes or lesions  Neurologic:  Alert and oriented    Lab/Radiology/Other Diagnostic Tests:  24-hour labs:    Results for orders placed or performed during the hospital encounter of 01/15/24 (from the past 24 hours)   MAGNESIUM    Collection Time: 01/16/24  3:27 AM   Result Value Ref Range    Magnesium 1.8 1.6 - 2.6 mg/dL   HEMOGLOBIN R5J    Collection Time: 01/16/24  3:27 AM   Result Value Ref Range    Hemoglobin A1C 5.9 (H) 4.0 - 5.7 %   IRON + BINDING CAPACITY + %SAT+ FERRITIN    Collection Time: 01/16/24  3:27 AM   Result Value Ref Range    Iron 65 50 - 185 ?g/dL    Iron Binding-TIBC 884 270 - 380 mcg/dL    % Saturation 23 (L) 28 - 42 %    Transferrin 188 185 - 336 mg/dL    Ferritin 166 (H) 30 - 300 ng/mL   LIPID PROFILE    Collection Time: 01/16/24  3:27 AM   Result Value Ref Range    Cholesterol 64 <200 mg/dL    Triglycerides 68 <063 mg/dL    HDL 22 (L) >01 mg/dL    LDL 37 <601 mg/dL    VLDL 09.3 mg/dL    Non HDL Cholesterol 42 mg/dL   CBC AND DIFF    Collection Time: 01/16/24  3:27 AM   Result Value Ref Range    White Blood Cells 8.90 4.50 - 11.00 10*3/uL    Red Blood Cells 3.94 (L) 4.40 - 5.50 10*6/uL    Hemoglobin 12.4 (L) 13.5 - 16.5 g/dL    Hematocrit 23.5 (L) 40.0 - 50.0 %    MCV 92.7 80.0 - 100.0 fL    MCH 31.5 26.0 - 34.0 pg    MCHC 34.0 32.0 - 36.0 g/dL    RDW 57.3 (H) 22.0 - 15.0 %    Platelet Count 159 150 - 400 10*3/uL    MPV 7.7 7.0 - 11.0 fL    Neutrophils 75.0 41.0 - 77.0 %    Lymphocytes 11.3 (L)  24.0 - 44.0 %    Monocytes 10.9 4.0 - 12.0 %    Eosinophils 2.5 0.0 - 5.0 %    Basophils 0.3 0.0 - 2.0 %    Absolute Neutrophil Count 6.60 1.80 - 7.00 10*3/uL    Absolute Lymph Count 1.00 1.00 - 4.80 10*3/uL    Absolute Monocyte Count 1.00 (H) 0.00 - 0.80 10*3/uL    Absolute Eosinophil Count 0.20 0.00 - 0.45 10*3/uL    Absolute Basophil Count 0.00 0.00 - 0.20 10*3/uL    MDW (Monocyte Distribution Width) 21.1 (H) <=20.6   COMPREHENSIVE METABOLIC PANEL    Collection Time: 01/16/24  3:27 AM   Result Value Ref Range    Sodium 134 (L) 137 - 147 mmol/L    Potassium 3.7 3.5 - 5.1 mmol/L    Chloride 107 98 - 110 mmol/L    Glucose 89 70 - 100 mg/dL    Blood Urea Nitrogen 18 7 - 25 mg/dL    Creatinine 1.61 0.96 - 1.24 mg/dL    Calcium 7.6 (L) 8.5 - 10.6 mg/dL    Total Protein 5.6 (L) 6.0 - 8.0 g/dL    Total Bilirubin 3.5 (H) 0.2 - 1.3 mg/dL    Albumin 3.2 (L) 3.5 - 5.0 g/dL    Alk Phosphatase 045 (H) 25 - 110 U/L    AST 301 (H) 7 - 40 U/L    ALT 485 (H) 7 - 56 U/L    CO2 20 (L) 21 - 30 mmol/L    Anion Gap 7 3 - 12    Glomerular Filtration Rate (GFR) >60 >60 mL/min   BILIRUBIN, DIRECT    Collection Time: 01/16/24  3:27 AM   Result Value Ref Range    Bilirubin, Direct 2.2 (H) <0.4 mg/dL     Pertinent radiology reviewed.    Loise Rinks, MD

## 2024-01-16 NOTE — Care Plan
 Problem: Discharge Planning  Goal: Participation in plan of care  Outcome: Goal Ongoing  Goal: Knowledge regarding plan of care  Outcome: Goal Ongoing  Goal: Prepared for discharge  Outcome: Goal Ongoing

## 2024-01-16 NOTE — Progress Notes
 RT Adult Assessment Note    NAME:Billy May             MRN: [id number]             DOB:12-May-1951          AGE: 73 y.o.  ADMISSION DATE: 01/15/2024             DAYS ADMITTED: LOS: 1 day    Additional Comments:  Impressions of the patient: pt in NAD on RA with equal chest rise and fall and clear  breath sounds. Pt has an asthma hx with a PRN albuterol MDI,   Intervention(s)/outcome(s): rt eval   Patient education that was completed: none   Recommendations to the care team: none at this time     Vital Signs:  Pulse: 67  RR: 18 PER MINUTE  SpO2: 96 %  O2 Device: None (Room air)  Liter Flow:    O2%:      Breath Sounds:   All Breath Sounds: Clear (Implies normal)  Respiratory Effort:   Respiratory Effort/Pattern: Unlabored  Comments:

## 2024-01-17 ENCOUNTER — Encounter: Admit: 2024-01-17 | Discharge: 2024-01-17

## 2024-01-17 ENCOUNTER — Inpatient Hospital Stay: Admit: 2024-01-17 | Discharge: 2024-01-17

## 2024-01-17 DIAGNOSIS — R1013 Epigastric pain: Secondary | ICD-10-CM

## 2024-01-17 LAB — PROTIME INR (PT)
~~LOC~~ BKR INR: 1.1 (ref 0.9–1.2)
~~LOC~~ BKR PROTIME: 12 s (ref 10.2–12.9)

## 2024-01-17 NOTE — Case Management (ED)
 Case Management Admission Assessment    NAME:Billy May                          MRN: [id number]             DOB:1951-06-02          AGE: 73 y.o.  ADMISSION DATE: 01/15/2024             DAYS ADMITTED: LOS: 3 days      Today?s Date: 01/18/2024    Source of Information: patient  This CM met with pt for assessment on this date.  Provided contact information and explanation of SW/NCM roles.  Reviewed [facility], Preparing for Discharge, and [facility] hand-outs.  Provided opportunity for questions and discussion. Pt/family encouraged to contact Case Management team with questions and concerns during hospitalization and until patient is able to transition back to the patient's primary care physician.         Plan  Plan: Case Management Assessment, Assist PRN with SW/NCM Services  Anticipate DC home with family support pending additional recommendations or interventions.  Wife Andrey Campanile provide transportation home at DC.  No CM needs currently assessed. CM team continue to follow and assess additional CM needs.    Patient Address/Phone  [address]  [place] [address]  [phone] (home)     Emergency Contact  Extended Emergency Contact Information  Primary Emergency Contact: Cygan,Sandy  Home Phone: [phone]  Mobile Phone: [phone]  Relation: Spouse  Secondary Emergency Contact: Jess [place]ters [place]  Mobile Phone: [phone]  Relation: Daughter    Network engineer Directive: No, patient does not have a healthcare directive  Would patient like to fill out a (a new) Healthcare Directive?: No, patient declined  Lawyer (Psych unit only): No, patient does not have a Social research officer, government  Does the Patient Need Case Management to Arrange Discharge Transport? (ex: facility, ambulance, wheelchair/stretcher, Medicaid, cab, other): No  Will the Patient Use Family Transport?: Yes  Transportation Name, Phone and Availability #1: Wife Andrey Campanile [phone]    Expected Discharge Date  01/20/2024     Living Situation Prior to Admission  Living Arrangements  Type of Residence: Home, independent  Living Arrangements: Spouse/significant other  How many levels in the residence?: 1 (plus basement)  Can patient live on one level if needed?: Yes  Does residence have entry and/or inside stairs?: Yes (3 STE)  Assistance needed prior to admit or anticipated on discharge: No  Who provides assistance or could if needed?: Wife  Level of Function   Prior level of function: Independent  Cognitive Abilities   Cognitive Abilities: Alert and Oriented, Participates in decision making    Financial Resources  Coverage  Primary Insurance: Medicare  Secondary Insurance: Careers adviser mail carriers)  Medication Coverage    Medication Coverage: Nurse, learning disability Academic librarian)  Are current medications affordable?: Yes  Do You Manage Your Own Medications?: Yes  Source of Income   Source Of Income: Other retirement income  Financial Assistance Needed?  no    Psychosocial Needs  Mental Health  Mental Health History: No  Substance Use History  Substance Use History Screen: No  Other  none    Current/Previous Services  PCP  Osker Mason, [phone], [phone]  Pharmacy    [facility] [phone] - [place],  - [address]  [address]  [place] [id number]  Phone: [phone] Fax: [phone]    Durable Medical Equipment   Durable Medical Equipment at home: None  Home Health  Receiving home health: In the past  Agency name: [facility]  Hemodialysis or Peritoneal Dialysis  Undergoing hemodialysis or peritoneal dialysis: No  Tube/Enteral Feeds  Receive tube/enteral feeds: No  Infusion  Receive infusions: No  Private Duty  Private duty help used: No  Home and Community Based Services  Home and community based services: No  Ryan White  Ryan White: N/A  Hospice  Hospice: No  Outpatient Therapy  PT: In the past (PT, cardiac rehab)  Name of rehab location/group: [facility]  OT: No  SLP: No  Skilled Nursing Facility/Nursing Home  SNF: No  NH: No  Inpatient Rehab  IPR: No  Long-Term Acute Care Hospital  LTACH: No  Acute Hospital Stay  Acute Hospital Stay: In the past  Was patient's stay within the last 30 days?: No (03/2023)      Prudencio Browner RN BSN  Integrated Nurse Case Manager  [phone]/ [phone]/ Voalte [phone]

## 2024-01-17 NOTE — Care Plan
 Brief GI     Pending MRCP results, if evidence of retained choledocholithiasis or biliary obstruction, will plan on EUS/ERCP 4/1, otherwise would monitor LFTs.   Surgery eval for cholecystectomy.     Will follow     Cheri Coria. Eliot Guernsey, MD, FACP  Gastroenterology

## 2024-01-17 NOTE — Progress Notes
 OCCUPATIONAL THERAPY  NOTE     Name: Billy May   MRN: [id number]     DOB: 07-20-1951      Age: 73 y.o.  Admission Date: 01/15/2024     LOS: 2 days     Date of Service: 01/17/2024    Per discussion with RN, patient has no concerns with completing activities of daily living with no OT goals identified. Patient is ambulating 4-5 laps at a time without device. OT and PT will discontinue service, please re-consult if the patient has a decline in functional status.     Therapist: Norine Beau, OTR/L  Date: 01/17/2024

## 2024-01-17 NOTE — Progress Notes
 General Progress Note    Name: Billy May   MRN: [id number]     DOB: [birth year]      Age: 73 y.o.  Admission Date: 01/15/2024     LOS: 2 days     Date of Service: 01/17/2024          Assessment/Plan:    Principal Problem:    Epigastric pain  Active Problems:    Mixed dyslipidemia    Essential hypertension    Coronary artery disease    GERD (gastroesophageal reflux disease)    S/P CABG x 4    LBBB (left bundle branch block)    Elevated d-dimer    Elevated LFTs    Elevated lipase    Billy May is a 73 yo M with  PMH of CAD s/p CABG 2018, diastolic CHF NYHA 2, HTN, obesity, GERD, HLD who was admitted on 01/15/2024 for chest pains with ACS concerns as a direct transfer to [facility]. He was admitted to Cardiology service for ACS rule out. Work-up was unrevealing for cardiac cause, suspect chest pain is GI in nature. He is stable to be transferred to Internal Medicine for further work-up.      Elevated liver enzymes - improving  Hyperbilirubinemia - improving  Epigastric pain   GERD  -OSH records: AST > 1000, ALT > 900  -Labs on [place] admission: AST 937, ALT 805, ALP 133, Tbili 1.3  -Intermittent alcohol use, denies heavy use since teenage years, denies Tylenol use  -Acute hepatitis panel negative  -CTA C/A/P w/ 3/29: CHEST: No pulmonary embolism. No consolidation, pleural effusion, or pneumothorax. Mildly patulous, partially fluid-filled esophagus suggestive of gastroesophageal reflux and/or esophageal dysmotility. A follow-up esophagram could be obtained for further evaluation.   ABDOMEN AND PELVIS: Cholelithiasis without cholecystitis, choledocholithiasis, or biliary ductal dilation  Plan:  -Hepatology consulted. Obtain MRCP. Decision for need for ERCP based upon MRCP results  -Continue PTA PPI, increase to BID      CAD s/p CABG 2018  Diastolic HF  HTN  HLD   -Concern at OSH for STEMI, transferred to 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, serial troponins were negative, suspect non-cardiac cause of chest pain likely GI as above   -2D echo 3/29: LVEF 50-55%, abnormal septal motion 2/2 LBBB and postoperative state, grade 1 diastolic dysfunction, normal RV size and borderline systolic function, mild RA dilatation, normal LA size, mild aortic sclerosis without stenosis, mild AR, CVP 0-5 mmHg, PASP 30 mmHg   -Lipid profile: LDL 37   -Continue PTA ASA 81 mg daily, amlodipine 5 mg daily, losartan 100 mg daily, rosuvastatin 10 mg daily      Hypothyroidism  -Continue PTA synthroid      BPH  -Continue PTA tamsulosin 0.4 mg daily           FEN: No IVF, replace lytes PRN, cardiac diet  Ppx: Lovenox  Code status: FULL  Dispo: Continue inpatient admission    Total Time Today was 50 minutes in the following activities: Preparing to see the patient, Obtaining and/or reviewing separately obtained history, Performing a medically appropriate examination and/or evaluation, Counseling and educating the patient/family/caregiver, Ordering medications, tests, or procedures, Referring and communication with other health care professionals (when not separately reported), Documenting clinical information in the electronic or other health record, and Care coordination (not separately reported)    [name]  Internal Medicine, Hospitalist    Voalte is the preferred method of communication.   Please use the  Med Private First Call for all patient-related communications. Personal Voaltes and pagers are not answered at all hours.    Note to patient: The 21st Century Cures Act makes medical notes like these available to patients in the interest of transparency. However, be advised this is a medical document. It is intended as peer to peer communication. It is written in medical language and may contain abbreviations or verbiage that are unfamiliar. It may appear blunt or direct. Medical documents are intended to carry relevant information, facts as evident, and the clinical opinion of the practitioner.  ________________________________________________________________________    Subjective  Billy May is a 73 y.o. male. Patient had no acute overnight events. He is feeling fairly well this afternoon. He reports that his lower chest/epigatric pain that was radiating to his back has completely resolved although he has not eaten anything yet today. He reports otherwise he is feeling fairly well and has no other acute complaints. Denies any current fevers, chills, HA, light-headedness, chest pain, dyspnea, cough, abdominal pain, constipation, diarrhea.    Medications  Scheduled Meds:allopurinoL (ZYLOPRIM) tablet 300 mg, 300 mg, Oral, QDAY  amitriptyline (ELAVIL) tablet 10 mg, 10 mg, Oral, QHS  amLODIPine (NORVASC) tablet 5 mg, 5 mg, Oral, BID  aspirin EC (ASPIR-LOW) tablet 81 mg, 81 mg, Oral, QHS  enoxaparin (LOVENOX) syringe 40 mg, 40 mg, Subcutaneous, QDAY(21)  gabapentin (NEURONTIN) capsule 300 mg, 300 mg, Oral, QHS  levothyroxine (SYNTHROID) tablet 50 mcg, 50 mcg, Oral, QDAY 30 min before breakfast  losartan (COZAAR) tablet 100 mg, 100 mg, Oral, QDAY  meloxicam (MOBIC) tablet 15 mg, 15 mg, Oral, QDAY  pantoprazole DR (PROTONIX) tablet 40 mg, 40 mg, Oral, BID(11-21)  rosuvastatin (CRESTOR) tablet 10 mg, 10 mg, Oral, QDAY  tamsulosin (FLOMAX) capsule 0.4 mg, 0.4 mg, Oral, QHS    Continuous Infusions:  PRN and Respiratory Meds:albuterol sulfate Q4H PRN, alum-mag hydroxide-simeth TID w/ meals PRN **AND** lidocaine hcl viscous TID w/ meals PRN, nitroglycerin Q5 MIN PRN, simethicone Q6H PRN      Review of Systems:  No positives. Negative for fevers, chills, HA, vision changes, light-headedness, dizziness, chest pain, dyspnea, cough, abdominal pain, nausea, vomiting, constipation, diarrhea, dysuria, skin changes.    Objective:                          Vital Signs: Last Filed                 Vital Signs: 24 Hour Range   BP: 121/73 (03/31 1200)  Temp: 37.1 ?C (98.7 ?F) (03/31 1200)  Pulse: 65 (03/31 1200)  Respirations: 18 PER MINUTE (03/30 2112)  SpO2: 95 % (03/31 1200)  O2 Device: None (Room air) (03/31 0900) BP: (109-143)/(60-79)   Temp:  [36.9 ?C (98.5 ?F)-37.1 ?C (98.7 ?F)]   Pulse:  [58-67]   Respirations:  [17 PER MINUTE-18 PER MINUTE]   SpO2:  [95 %-97 %]   O2 Device: None (Room air)     Vitals:    01/15/24 0700 01/16/24 0400   Weight: 88.8 kg (195 lb 12.8 oz) 89.1 kg (196 lb 6.4 oz)       Intake/Output Summary:  (Last 24 hours)    Intake/Output Summary (Last 24 hours) at 01/17/2024 1659  Last data filed at 01/17/2024 0430  Gross per 24 hour   Intake 350 ml   Output 3650 ml   Net -3300 ml           Physical Exam  General appearance: alert, well-developed, well-nourished, cooperative, and no distress  Head: Normocephalic, without obvious abnormality, atraumatic  Eyes: negative findings: conjunctivae and sclerae normal and corneas clear  Neck: supple, symmetrical, trachea midline and no JVD  Lungs: clear to auscultation bilaterally  Heart: regular rate and rhythm, S1, S2 normal, no murmur, click, rub or gallop  Abdomen: soft, non-tender. Bowel sounds normal. No masses,  no organomegaly  Extremities: extremities normal, atraumatic, no cyanosis or edema  Neurologic: Grossly normal  Peripheral pulses:  2+ and symmetric  Skin: Skin color, texture, turgor normal. No rashes or lesions    Lab Review  Pertinent labs reviewed    Point of Care Testing  (Last 24 hours)  Glucose: 99 (01/17/24 0459)    Radiology and other Diagnostics Review:    Pertinent radiology reviewed.    Alphonso Jean, MD

## 2024-01-18 ENCOUNTER — Encounter: Admit: 2024-01-18 | Discharge: 2024-01-18

## 2024-01-18 ENCOUNTER — Inpatient Hospital Stay
Admit: 2024-01-15 | Discharge: 2024-01-18 | Disposition: A | Discharge status: Home / Self Care | Payer: PRIVATE HEALTH INSURANCE | Source: Other Acute Inpatient Hospital | Admitting: Cardiovascular Disease

## 2024-01-18 DIAGNOSIS — E782 Mixed hyperlipidemia: Secondary | ICD-10-CM

## 2024-01-18 DIAGNOSIS — Z951 Presence of aortocoronary bypass graft: Secondary | ICD-10-CM

## 2024-01-18 DIAGNOSIS — I2510 Atherosclerotic heart disease of native coronary artery without angina pectoris: Secondary | ICD-10-CM

## 2024-01-18 DIAGNOSIS — I5032 Chronic diastolic (congestive) heart failure: Secondary | ICD-10-CM

## 2024-01-18 DIAGNOSIS — Z79899 Other long term (current) drug therapy: Secondary | ICD-10-CM

## 2024-01-18 DIAGNOSIS — E039 Hypothyroidism, unspecified: Secondary | ICD-10-CM

## 2024-01-18 DIAGNOSIS — K219 Gastro-esophageal reflux disease without esophagitis: Secondary | ICD-10-CM

## 2024-01-18 DIAGNOSIS — Z8249 Family history of ischemic heart disease and other diseases of the circulatory system: Secondary | ICD-10-CM

## 2024-01-18 DIAGNOSIS — K2960 Other gastritis without bleeding: Secondary | ICD-10-CM

## 2024-01-18 DIAGNOSIS — N401 Enlarged prostate with lower urinary tract symptoms: Secondary | ICD-10-CM

## 2024-01-18 DIAGNOSIS — Z823 Family history of stroke: Secondary | ICD-10-CM

## 2024-01-18 DIAGNOSIS — R1013 Epigastric pain: Secondary | ICD-10-CM

## 2024-01-18 DIAGNOSIS — M109 Gout, unspecified: Secondary | ICD-10-CM

## 2024-01-18 DIAGNOSIS — Z6830 Body mass index (BMI) 30.0-30.9, adult: Secondary | ICD-10-CM

## 2024-01-18 DIAGNOSIS — Z79890 Hormone replacement therapy (postmenopausal): Secondary | ICD-10-CM

## 2024-01-18 DIAGNOSIS — I447 Left bundle-branch block, unspecified: Secondary | ICD-10-CM

## 2024-01-18 DIAGNOSIS — E66811 Obesity, class 1: Secondary | ICD-10-CM

## 2024-01-18 DIAGNOSIS — Z7982 Long term (current) use of aspirin: Secondary | ICD-10-CM

## 2024-01-18 DIAGNOSIS — I110 Hypertensive heart disease with heart failure: Secondary | ICD-10-CM

## 2024-01-18 DIAGNOSIS — N138 Other obstructive and reflux uropathy: Secondary | ICD-10-CM

## 2024-01-18 DIAGNOSIS — K8020 Calculus of gallbladder without cholecystitis without obstruction: Secondary | ICD-10-CM

## 2024-01-18 LAB — LIPOPROTEIN A: ~~LOC~~ BKR LIPOPROTEIN A: 111 mg/dL — ABNORMAL HIGH (ref <=29)

## 2024-01-18 NOTE — Consults
 [facility] Consult     Patient: Billy May, Billy May [id number]  Admission Date:  01/15/2024, LOS: 3 days  Admission Diagnosis: ACS (acute coronary syndrome) (CMS-HCC) [I24.9]  Date of Service: January 18, 2024  CONSULT EMERGENCY GENERAL SURGERY PHYSICIAN  Consult performed by: Mallissa Seals, DO  Consult ordered by: Alphonso Jean, MD           ASSESSMENT:   Billy May is a 73 y.o. male with past medical history of CAD s/p CABG 2018, HFrEF (50-55%), HTN, obesity, GERD, and HLD who presented with concerns of STEMI, however, did not require intervention. Consulted for cholelithiasis and evaluation for cholecystectomy.    Patient initially seen at: 1230      PLAN:  - No acute surgical intervention. Most likely passed a gallstone during his presentation on admission as his T.Billi was elevated and has since been downtrending. MRCP is indicative of cholelithiasis, with no concerns of acute cholecystitis. ERCP not required per GI. Furthermore, CT completed did not show any other ongoing intraabdominal process.  - Discussed with patient he is a reasonable candidate for cholecystectomy, however, he is unsure if he would like to proceed vs wait in the summer time. Discussed both options are reasonable. He elected to wait till the summer time. Will make an appointment for July to see us  in clinic.    - Underwent recent cardiac work up, will not need further risk stratification   - Please page with any questions or concerns. Thank you for this consult.     To be discussed with staff surgeon, Dr. Denver Flaming.    Mallissa Seals, DO  Service Pager: [phone]  _____________________________________________________________________________    HPI: Billy May is a 73 y.o. male who presented on 3/29 with concerns of STEMI due to abdominal pain and abnormal EKG from OSH, however, cardiac work up has been negative at [facility]. He states that he had 10/10 sharp epigastric region that radiated towards his back. States that he has never had pain like this before. He does not associate the pain with food. Denies nausea, emesis, fever, chills, or shortness of breath. Denies any history of right upper quadrant pain that is associated with fatty meals or in general. He has never had abdominal surgery in the past. Not on any blood thinners. 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 Last ECHO was completed 3/29 showed EF of 50-55%    Past Medical History:    Abnormal cardiovascular stress test    CAD (coronary artery disease)    Chest pain    GERD (gastroesophageal reflux disease)    Gout    Hyperlipemia    Hypothyroidism    Obesity, Class I, BMI 30-34.9     Surgical History:   Procedure Laterality Date    HX HEART CATHETERIZATION  2018    ANGIOGRAPHY CORONARY ARTERY WITH LEFT HEART CATHETERIZATION WITH VENTRICULOGRAPHY Left 11/19/2016    Performed by Drucella Georgi, MD at [facility] CATH LAB    POSSIBLE PERCUTANEOUS CORONARY STENT PLACEMENT WITH ANGIOPLASTY N/A 11/19/2016    Performed by Drucella Georgi, MD at [facility] CATH LAB    BYPASS GRAFT CORONARY ARTERY x 4, Harvest of left saphenous vein and left internal mammary artery N/A 11/27/2016    Performed by Dossie Gayer, MD at [facility] CVOR    HX TONSILLECTOMY  ROTATOR CUFF REPAIR Left      Family History   Problem Relation Name Age of Onset    Heart Attack Mother      Chronic Lung Disease Mother      Heart Surgery Brother      Heart Attack Brother  72    Stroke Maternal Grandmother      Heart Attack Maternal Grandfather       Social History     Tobacco Use    Smoking status: Never    Smokeless tobacco: Never   Substance Use Topics    Alcohol use: Yes     Comment: Occasional, 2 per month     Your Current Medications:         Instructions    allopurinol (ZYLOPRIM) 300 mg tablet Take one tablet by mouth daily. Take with food.    amitriptyline (ELAVIL) 10 mg tablet TAKE ONE TABLET BY MOUTH AT BEDTIME DAILY.    amLODIPine (NORVASC) 5 mg tablet Take one tablet by mouth twice daily. Change in dose.  Stop metoprolol.    aspirin EC 81 mg tablet Take one tablet by mouth at bedtime daily. Take with food.    gabapentin (NEURONTIN) 300 mg capsule Take one capsule by mouth daily.    levothyroxine (SYNTHROID) 50 mcg tablet Take one tablet by mouth daily 30 minutes before breakfast.    losartan (COZAAR) 100 mg tablet TAKE 1 TABLET BY MOUTH EVERY DAY    meloxicam (MOBIC) 15 mg tablet Take one tablet by mouth daily.    nitroglycerin (NITROSTAT) 0.4 mg tablet Place one tablet under tongue every 5 minutes as needed for Chest Pain. Max of 3 tablets, call 911.    pantoprazole DR (PROTONIX) 40 mg tablet Take one tablet by mouth daily.    rosuvastatin (CRESTOR) 10 mg tablet Take one tablet by mouth daily.    tamsulosin (FLOMAX) 0.4 mg capsule Take one capsule by mouth at bedtime daily.            ROS  A 14 point comprehensive review of systems is negative except as noted in the HPI.    Vitals:  BP: (115-150)/(70-85)   Temp:  [36.5 ?C (97.7 ?F)-37.1 ?C (98.8 ?F)]   Pulse:  [62-75]   Respirations:  [16 PER MINUTE]   SpO2:  [94 %-98 %]   O2 Device: None (Room air)  Body mass index is 30.23 kg/m?Marland Kitchen    Physical Exam  Gen: NAD  HEENT: Anicteric sclerae  CV: Normal rate  Pulm: Non-labored respirations on RA  Abd: Soft, ND, non-tender to palpation, negative murphy's sign  Neuro: GCS 15, MAE spontaneously  Psych: Normal affect    Lab/Radiology/Other Diagnostic Tests:  Lab Results   Component Value Date/Time    NA 134 (L) 01/18/2024 05:48 AM    K 4.4 01/18/2024 05:48 AM    CL 101 01/18/2024 05:48 AM    CO2 24 01/18/2024 05:48 AM    BUN 15 01/18/2024 05:48 AM    CR 0.98 01/18/2024 05:48 AM    MG 2.1 01/18/2024 05:48 AM          Lab Results   Component Value Date/Time    HGB 14.2 01/18/2024 05:48 AM    HCT 42.4 01/18/2024 05:48 AM    WBC 7.00 01/18/2024 05:48 AM    PLTCT 185 01/18/2024 05:48 AM    INR 1.1 01/18/2024 05:48 AM     Lab Results Component Value Date/Time    GLUPOC 151 (H) 12/02/2016 07:44  AM    GLUPOC 123 (H) 12/01/2016 09:37 PM    GLUPOC 109 (H) 12/01/2016 03:50 PM      Physical Exam      MRI MRCP   Final Result      1. Cholelithiasis and small sludge in the mildly distended gallbladder. No biliary ductal dilatation or choledocholithiasis.      2. No upper abdominal lymphadenopathy or ascites.          Finalized by SHAUN BEST, M.D. on 01/18/2024 7:22 AM. Dictated by Rober Chimera, M.D. on 01/18/2024 7:05 AM.      CT ABD/PELV W CONTRAST   Final Result         CHEST:      1. No pulmonary embolism. No consolidation, pleural effusion, or pneumothorax.    2. Mildly patulous, partially fluid-filled esophagus suggestive of gastroesophageal reflux and/or esophageal dysmotility. A follow-up esophagram could be obtained for further evaluation.       ABDOMEN AND PELVIS:      Cholelithiasis without cholecystitis, choledocholithiasis, or biliary ductal dilation.      By my electronic signature, I attest that I have personally reviewed the images for this examination and formulated the interpretations and opinions expressed in this report          Finalized by Heinz Llano, D.O. on 01/15/2024 2:48 PM. Dictated by Magali Schmitz, MD on 01/15/2024 2:27 PM.      CTA CHEST PULM EMBOLISM W/CONT   Final Result         CHEST:      1. No pulmonary embolism. No consolidation, pleural effusion, or pneumothorax.    2. Mildly patulous, partially fluid-filled esophagus suggestive of gastroesophageal reflux and/or esophageal dysmotility. A follow-up esophagram could be obtained for further evaluation.       ABDOMEN AND PELVIS:      Cholelithiasis without cholecystitis, choledocholithiasis, or biliary ductal dilation.      By my electronic signature, I attest that I have personally reviewed the images for this examination and formulated the interpretations and opinions expressed in this report          Finalized by Heinz Llano, D.O. on 01/15/2024 2:48 PM. Dictated by Magali Schmitz, MD on 01/15/2024 2:27 PM.      2D + DOPPLER ECHO   Final Result          Malnutrition Details:                                                   Active Wounds

## 2024-01-18 NOTE — Discharge Instructions - Appointments
 Please follow up with your primary care doctor within 7 days of discharge

## 2024-01-18 NOTE — Care Plan
 Problem: Discharge Planning  Goal: Participation in plan of care  Outcome: Goal Ongoing  Goal: Knowledge regarding plan of care  Outcome: Goal Ongoing  Goal: Prepared for discharge  Outcome: Goal Ongoing

## 2024-01-18 NOTE — Progress Notes
 BRIEF GI NOTE:     MRCP reviewed - no indication for ERCP.   LFTs down trending.    Recommendations:   -Trend LFT  -Consider surgery consult for cholecystectomy  -Repeat LFT in 3 months  -If LFT elevated, PCP can consider Hepatology consult outpatient  -Please reach out to the GI fellow on call if questions arise.     Case discussed.    Biana Haggar Arlana Labor, MD  Fellow, Division of Gastroenterology, Hepatology & Motility  [facility]

## 2024-01-18 NOTE — Discharge Instructions - Pharmacy
 Discharge Summary      Name: Billy May  Medical Record Number: [id number]        Account Number:  [id number]  Date Of Birth:  Sep 21, 1951                         Age:  73 y.o.  Admit date:  01/15/2024                     Discharge date: 01/18/2024      Discharge Attending:  Dr. Leonia Reeves  Discharge Summary Completed By: Raj Janus, MD    Service: Med Private D- [phone]    Reason for hospitalization:  ACS (acute coronary syndrome) (CMS-HCC) [I24.9]    Primary Discharge Diagnosis:   Epigastric pain    Hospital Diagnoses:  Hospital Problems        Active Problems    * (Principal) Epigastric pain    Mixed dyslipidemia    Essential hypertension    Coronary artery disease    GERD (gastroesophageal reflux disease)    S/P CABG x 4    LBBB (left bundle branch block)    Elevated d-dimer    Elevated LFTs    Elevated lipase     Present on Admission:   Epigastric pain   Coronary artery disease   Essential hypertension   Mixed dyslipidemia   GERD (gastroesophageal reflux disease)   S/P CABG x 4        Significant Past Medical History        Abnormal cardiovascular stress test  CAD (coronary artery disease)  Chest pain  GERD (gastroesophageal reflux disease)  Gout  Hyperlipemia  Hypothyroidism  Obesity, Class I, BMI 30-34.9    Allergies   Atorvastatin and Sulfa (sulfonamide antibiotics)    Brief Hospital Course   The patient was admitted and the following issues were addressed during this hospitalization: (with pertinent details including admission exam/imaging/labs).      Billy May is a 73 yo M with PMH of CAD s/p CABG 2018, diastolic CHF NYHA 2, HTN, obesity, GERD, HLD who was admitted on 01/15/2024 for chest pains with ACS concerns as a direct transfer to [facility]. He was admitted to Cardiology service for ACS rule out. Work-up was unrevealing for cardiac cause, suspect chest pain is GI in nature. He is stable to be transferred to Internal Medicine for further work-up. LFT's noted to be in low 1000 range in addition to elevated bilirubin. CT A/P showed cholelithiasis without cholecystitis or biliary ductal dilation. Hepatology was consulted. His LFT's and bilirbuin trended down nicely without intervention. MRCP was eventually obtained which showed cholelithiasis but no other abnormalities. It was thought that patient had choledocholithiasis and eventually passed the stone. This likely accounted for all of his symptoms he presented for. Gen surg was consulted and discussed cholecystectomy with patient. 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 He is aware of risk of recurrence. Follow up with surgery team requested. He will have a CMP drawn in 1 week to ensure his LFT's/bilirubin return to normal.        Discharge Planning: greater than 30 minutes spent in counseling pt, coordinating discharge care, placing discharge orders and complete discharge summary.    Day of discharge exam notable for:   General appearance: alert, well-developed, well-nourished, cooperative, and no distress  Head: Normocephalic, without obvious abnormality, atraumatic  Eyes:  negative findings: conjunctivae and sclerae normal and corneas clear  Neck: supple, symmetrical, trachea midline and no JVD  Lungs: clear to auscultation bilaterally  Heart: regular rate and rhythm, S1, S2 normal, no murmur, click, rub or gallop  Abdomen: soft, non-tender. Bowel sounds normal. No masses,  no organomegaly  Extremities: extremities normal, atraumatic, no cyanosis or edema  Neurologic: Grossly normal  Peripheral pulses:  2+ and symmetric  Skin: Skin color, texture, turgor normal. No rashes or lesions      Items Needing Follow Up   Pending items or areas that need to be addressed at follow up: As noted above    Pending Labs and Follow Up Radiology    Pending labs and/or radiology review at this time of discharge are listed below: Please note- any labs with collected status will not have a result; if this area is blank, there are no items for review.   Pending Labs       Order Current Status    LIPOPROTEIN A In process              Medications      Medication List      CONTINUE taking these medications     allopurinoL 300 mg tablet; Commonly known as: ZYLOPRIM; Dose: 300 mg;   Refills: 0   amitriptyline 10 mg tablet; Commonly known as: ELAVIL; Dose: 10 mg;   Doctor's comments: PATIENT REQUESTING REFILL; TAKE ONE TABLET BY MOUTH AT   BEDTIME DAILY.; Quantity: 90 tablet; Refills: 3   amLODIPine 5 mg tablet; Commonly known as: NORVASC; Dose: 5 mg; Take one   tablet by mouth twice daily. Change in dose.  Stop metoprolol.; Quantity:   180 tablet; Refills: 3   aspirin EC 81 mg tablet; Commonly known as: ASPIR-LOW; Dose: 81 mg;   Refills: 0   gabapentin 300 mg capsule; Commonly known as: NEURONTIN; Dose: 300 mg;   Refills: 0   levothyroxine 50 mcg tablet; Commonly known as: SYNTHROID; Dose: 50 mcg;   Refills: 0   losartan 100 mg tablet; Commonly known as: COZAAR; TAKE 1 TABLET BY   MOUTH EVERY DAY; Quantity: 90 tablet; Refills: 0   nitroglycerin 0.4 mg tablet; Commonly known as: NITROSTAT; Dose: 0.4 mg;   Place one tablet under tongue every 5 minutes as needed for Chest Pain.   Max of 3 tablets, call 911.; Quantity: 25 tablet; Refills: 3   pantoprazole DR 40 mg tablet; Commonly known as: PROTONIX; Dose: 40 mg;   Refills: 0   rosuvastatin 10 mg tablet; Commonly known as: CRESTOR; Dose: 10 mg; Take   one tablet by mouth daily.; Quantity: 90 tablet; Refills: 3   tamsulosin 0.4 mg capsule; Commonly known as: FLOMAX; Dose: 0.4 mg;   Refills: 0     STOP taking these medications     meloxicam 15 mg tablet; Commonly known as: MOBIC       Return Appointments and Scheduled Appointments     Scheduled appointments:      Apr 20, 2024 8:30 AM  Office visit with Marja Sierra, MD  Cardiovascular Medicine: [facility] (CVM Exam) [address]  [address]  [place] [address]  [phone]   Additional appointment instructions:    Please follow up with your primary care doctor within 7 days of discharge         Additional Discharge Appointments    Please follow up with your primary care doctor within 7 days of discharge  Consults, Procedures, Diagnostics, Micro, Pathology   Consults: Hepatology, Internal Medicine, and General Surgery  Surgical Procedures & Dates: None  Significant Diagnostic Studies, Micro and Procedures: noted in brief hospital course  Significant Pathology: none                       Discharge Disposition, Condition   Patient Disposition: Home or Self Care [01]  Condition at Discharge: Stable    Code Status   Full Code    Patient Instructions     Activity       Activity as Tolerated   As directed      It is important to keep increasing your activity level after you leave the hospital.  Moving around can help prevent blood clots, lung infection (pneumonia) and other problems.  Gradually increasing the number of times you are up moving around will help you return to your normal activity level more quickly.  Continue to increase the number of times you are up to the chair and walking daily to return to your normal activity level. Begin to work toward your normal activity level at discharge          Diet       Cardiac Diet   As directed      Limiting unhealthy fats and cholesterol is the most important step you can take in reducing your risk for cardiovascular disease.  Unhealthy fats include saturated and trans fats.  Monitor your sodium and cholesterol intake.  Restrict your sodium to 2g (grams) or 2000mg  (milligrams) daily, and your cholesterol to 200mg  daily.    If you have questions regarding your diet at home, you may contact a dietitian at [phone].             Discharge education provided to patient.    Additional Orders: Case Management, Supplies, Home Health     Home Health/DME       None              Signed:  Alphonso Jean, MD  01/18/2024      cc:  Primary Care Physician:  [facility]   Verified    Referring physicians:  Unknown, Unknown, MD   Additional provider(s):        Did we miss something? If additional records are needed, please fax a request on office letterhead to [phone]. Please include the patient's name, date of birth, fax number and type of information needed. Additional request can be made by email at [email or web address]. For general questions of information about electronic records sharing, call [phone].

## 2024-01-18 NOTE — Progress Notes
 Received referral - patient to be considered for elective cholecystectomy.  Order for outpatient [place] placed.

## 2024-01-20 ENCOUNTER — Encounter: Admit: 2024-01-20 | Discharge: 2024-01-20

## 2024-01-20 NOTE — Progress Notes
 Verified fax number with radiology dept. At [facility].     Faxed [place] orders to [facility]   F: [phone]  [phone]   Success received; sent to be scanned in.

## 2024-01-25 ENCOUNTER — Encounter: Admit: 2024-01-25 | Discharge: 2024-01-25

## 2024-01-25 DIAGNOSIS — K8020 Calculus of gallbladder without cholecystitis without obstruction: Secondary | ICD-10-CM

## 2024-01-25 DIAGNOSIS — R7989 Other specified abnormal findings of blood chemistry: Secondary | ICD-10-CM

## 2024-01-25 LAB — COMPREHENSIVE METABOLIC PANEL
ALBUMIN: 4 (ref 3.4–4.8)
ALK PHOSPHATASE: 120 (ref 40–150)
ANION GAP: 1.2 (ref 1.0–2.0)
AST: 38 — ABNORMAL HIGH (ref 11–34)
BLD UREA NITROGEN: 24 (ref 8.4–25)
CALCIUM: 9.7 (ref 8.4–10.2)
CHLORIDE: 107 (ref 98–107)
CO2: 24 (ref 23–31)
CREATININE: 1.2 (ref 0.72–1.25)
GFR ESTIMATED: 61 (ref >59)
GLUCOSE,PANEL: 94 (ref 70–105)
POTASSIUM: 4.8 (ref 3.5–5.1)
SODIUM: 136 (ref 136–145)
TOTAL BILIRUBIN: 0.6 (ref 0.2–1.2)
TOTAL PROTEIN: 7.3 (ref 6.4–8.3)

## 2024-01-27 ENCOUNTER — Encounter: Admit: 2024-01-27 | Discharge: 2024-01-27

## 2024-01-28 ENCOUNTER — Encounter: Admit: 2024-01-28 | Discharge: 2024-01-28 | Payer: MEDICARE

## 2024-02-01 ENCOUNTER — Encounter: Admit: 2024-02-01 | Discharge: 2024-02-01 | Payer: MEDICARE

## 2024-02-02 ENCOUNTER — Encounter: Admit: 2024-02-02 | Discharge: 2024-02-02 | Payer: MEDICARE

## 2024-02-03 ENCOUNTER — Encounter: Admit: 2024-02-03 | Discharge: 2024-02-03 | Payer: MEDICARE

## 2024-02-04 ENCOUNTER — Encounter: Admit: 2024-02-04 | Discharge: 2024-02-04 | Payer: MEDICARE

## 2024-02-10 ENCOUNTER — Encounter: Admit: 2024-02-10 | Discharge: 2024-02-10 | Payer: MEDICARE

## 2024-02-10 DIAGNOSIS — Z136 Encounter for screening for cardiovascular disorders: Secondary | ICD-10-CM

## 2024-02-10 DIAGNOSIS — I5032 Chronic diastolic (congestive) heart failure: Secondary | ICD-10-CM

## 2024-02-10 DIAGNOSIS — E782 Mixed hyperlipidemia: Secondary | ICD-10-CM

## 2024-02-10 DIAGNOSIS — I2089 Stable angina pectoris: Secondary | ICD-10-CM

## 2024-02-10 DIAGNOSIS — Z0389 Encounter for observation for other suspected diseases and conditions ruled out: Secondary | ICD-10-CM

## 2024-02-10 DIAGNOSIS — Z8249 Family history of ischemic heart disease and other diseases of the circulatory system: Secondary | ICD-10-CM

## 2024-02-10 DIAGNOSIS — I10 Essential (primary) hypertension: Secondary | ICD-10-CM

## 2024-02-10 MED ORDER — ISOSORBIDE MONONITRATE 30 MG PO TB24
30 mg | ORAL_TABLET | Freq: Every morning | ORAL | 1 refills | 90.00000 days | Status: AC
Start: 2024-02-10 — End: ?

## 2024-02-10 MED ORDER — NITROGLYCERIN 0.4 MG SL SUBL
.4 mg | ORAL_TABLET | SUBLINGUAL | 3 refills | 9.00000 days | Status: AC | PRN
Start: 2024-02-10 — End: ?

## 2024-02-10 MED ORDER — LOSARTAN 50 MG PO TAB
50 mg | ORAL_TABLET | Freq: Every day | ORAL | 3 refills | 90.00000 days | Status: AC
Start: 2024-02-10 — End: ?

## 2024-02-10 MED ORDER — METOPROLOL TARTRATE 25 MG PO TAB
12.5 mg | ORAL_TABLET | Freq: Two times a day (BID) | ORAL | 3 refills | 90.00000 days | Status: DC
Start: 2024-02-10 — End: 2024-02-10

## 2024-02-10 NOTE — Patient Instructions
 CARDIAC CATHETERIZATION   PRE-ADMISSION INSTRUCTIONS    Patient Name: Billy May  MRN#: [id number]  Date of Birth: 06/02/1951 (73 y.o.)  Today's Date: 02/10/2024    PROCEDURE:  You are scheduled for a Coronary Angiogram with possible Angioplasty/Stenting with Dr. Avelino Lek.    PROCEDURE DATE AND ARRIVAL TIME:  Your procedure date is 02/21/24.  You will receive a call from the Cath lab staff between 8:00 a.m. and noon on the business day prior to your procedure to let you know at what time to arrive on the day of your procedure.    Please check in at the Admitting Desk in the [facility] for your procedure.   Address:  [address], [place], [place] [address]    [facility] Entrance and and take a right. Continue down the hallway past the Cardiovascular Medicine office. That hall will take [facility] into [facility]. Check in at the desk on the left side.)     (If you have further questions regarding your arrival time for the CV lab, please call [phone] by 3:00pm the day before your procedure. Please leave a message with your name and number, your call will be returned in a timely manner.)    PRE-PROCEDURE APPOINTMENTS:    02/10/24    Office visit to update history and physical (requirement within 30 days of procedure)  with  Dr. Anda Bamberg  at Cardiovascular Medicine  [facility]              Pre-Admission lab work required within 14 days of procedure: BMP and CBC at the lab of your choice.         FOOD AND DRINK INSTRUCTIONS  Nothing to eat after midnight before your procedure. No caffeine for 24 hours prior to your procedure. You will be under moderate sedation for your procedure.  You may drink clear liquids up to an hour before hospital arrival. This will be confirmed by the Cath lab staff the day before your procedure.     SPECIAL MEDICATION INSTRUCTIONS  Any new prescriptions will be sent to your pharmacy listed on file with us .        Please either take 4 baby aspirins (4 times 81mg ) or one full strength NON-COATED 325mg  aspirin .            HOLD ALL erectile dysfunction medications for 3 days, unless prescribed for pulmonary hypertension.  HOLD ALL over the counter vitamins or supplements on the morning of your procedure.      Additional Instructions  If you wear CPAP, please bring your mask and machine with you to the hospital.    Take a bath or shower with anti-bacterial soap the evening before, or the morning of the procedure.     Bring photo ID and your health insurance card(s).    Arrange for a driver to take you home from the hospital. Please arrange for a friend or family member to take you home from this test. You cannot take a Taxi, [employer], or public transportation as there has to be a responsible person to help care for you after sedation    Bring an accurate list of your current medications with you to the hospital (all medications and supplements taken daily).  Please use the medication list below and write in the date and time when you took your last dose before your procedure. Update this list of medications as needed.      Wear comfortable clothes and don't  bring valuables, other than photo identification card, with you to the hospital.    Please pack a bag for an overnight stay.     For patients who are staying overnight on the Cardiovascular Treatment and Recovery Unit (CTR), no visitor(s) will be allowed to sleep at the bedside.    Please review your pre-procedure instructions and bring them with you on the day of your procedure.  Call the office at  [phone]  with any questions. You may ask to speak with Dr. Rogelia Clarks nurse.      ALLERGIES  Allergies   Allergen Reactions    Atorvastatin MUSCLE PAIN    Sulfa (Sulfonamide Antibiotics) UNKNOWN and SEE COMMENTS     Unknown childhood reaction        CURRENT MEDICATIONS  Outpatient Encounter Medications as of 02/10/2024   Medication Sig Dispense Refill    allopurinol  (ZYLOPRIM ) 300 mg tablet Take one tablet by mouth daily. Take with food.      amLODIPine  (NORVASC ) 5 mg tablet Take one tablet by mouth twice daily. Change in dose.  Stop metoprolol . (Patient taking differently: Take one tablet by mouth daily. Change in dose.  Stop metoprolol .) 180 tablet 3    aspirin  EC 81 mg tablet Take one tablet by mouth at bedtime daily. Take with food.      gabapentin  (NEURONTIN ) 300 mg capsule Take one capsule by mouth daily.      levothyroxine  (SYNTHROID ) 50 mcg tablet Take one tablet by mouth daily 30 minutes before breakfast.      losartan  (COZAAR ) 50 mg tablet Take one tablet by mouth daily. 90 tablet 3    methocarbamoL (ROBAXIN) 750 mg tablet Take one tablet by mouth as Needed.      naproxen sodium (ALEVE) 220 mg tablet Take one tablet by mouth as Needed.      nitroglycerin  (NITROSTAT ) 0.4 mg tablet Place one tablet under tongue every 5 minutes as needed for Chest Pain. Max of 3 tablets, call 911. 25 tablet 3    pantoprazole  DR (PROTONIX ) 40 mg tablet Take one tablet by mouth daily.      rosuvastatin  (CRESTOR ) 10 mg tablet Take one tablet by mouth daily. 90 tablet 3    tamsulosin  (FLOMAX ) 0.4 mg capsule Take one capsule by mouth at bedtime daily.      traZODone (DESYREL) 50 mg tablet Take one tablet by mouth at bedtime daily.       No facility-administered encounter medications on file as of 02/10/2024.       _________________________________________  Form completed by: Celesta Coke, RN  Date completed: 02/10/24  Method: In person and given to the patient.

## 2024-02-10 NOTE — Progress Notes
 Date of Service: 02/10/2024    Billy May is a 73 y.o. male.       HPI   Billy May is followed for coronary artery disease, hypertension and hypercholesterolemia.  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  On January 15, 2024 he was hospitalized for abdominal discomfort which was attributed to symptomatic cholelithiasis, possible recent choledocholithiasis.  It was thought that he most likely passed a gallstone.  Outpatient gallbladder surgery was suggested but has not yet been performed.  Today he tells me he is having some intermittent discomfort which may also be related to cholelithiasis and occurs following meals.  It is not severe.  More concerning, Billy May tells me that he has been experiencing exertional chest discomfort consistent with angina.  He reports that he has noticed this over the past 6 to 12 months and it is slowly progressive but not unstable.  This is a different kind of discomfort than his gallbladder discomfort.  The exertional chest discomfort is a midsternal discomfort that feels like tightness and occurs only with activity.  This exertional chest discomfort is triggered if he is carrying heavy bags of animal feed or if he is rushing in a department store.  It stops promptly when he stops.  It is not unstable but is interfering with his daily activities.  Billy May also reports mild orthostasis which he attributes to his medications.  Otherwise, the patient reports no congestive symptoms, palpitations, sensation of sustained forceful heart pounding, falls, presyncope or syncope.  His exercise tolerance has been stable, although he does not currently have an exercise routine.  He does have a treadmill at home that he does not use.  The patient reports no myalgias, claudication, bleeding abnormalities, or strokelike symptoms.    Historically, on November 27, 2016 he underwent coronary artery bypass times 4 (left internal mammary artery anastomosed to the LAD, reverse saphenous vein graft anastomosed in sequence to the high diagonal and circumflex marginal artery and reverse saphenous vein graft anastomosed to the posterior descending artery).  He had Covid in December 2020.  He describes this as a mild case with cough and low-grade temperature and some muscle aches.  He was seen by a physician who performed an x-ray and said that he had bronchitis without pneumonia.  He was treated with prednisone. He reports that he underwent back surgery on 04/15/2023 with laminectomy to L2-L5 without complications.        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 Lela Purple  ANGIOGRAPHY:    The left main arises normally from the left coronary cusp and bifurcates into the LAD and the left circumflex artery.  The left main has about a 30% stenosis in its distal segment.    The LAD arises normally from the left main coronary artery.  The LAD is a type 3 vessel.  The LAD has diffuse 40% to 50% disease in its proximal, as well as in its mid section, and it is heavily calcified in the same area.  The LAD also has an 80% stenosis in its mid segment.  Distal to this, the LAD has about 20% to 30% stenosis in the mid section after the second diagonal branch.  The LAD gives off 3 diagonal branches.  The first of the 3 diagonal branches has about 50% to 60% stenosis in its proximal segment.     The circumflex artery arises normally from the left main coronary artery.  The left circumflex artery has 4-5 tandem lesions in its proximal to mid section, which are all varying from 70% to 90% stenosis.  It gives off an OM branch that does not appear to have any angiographic evidence of significant stenosis.  The RCA is a dominant vessel that arises normally from the right coronary cusp.  The RCA has about 50% stenosis in its ostial segment.  The mid to distal RCA is heavily calcified with diffuse disease of about 30% to 40%.  The PDA branch appears to have about 90% stenosis in its to distal segment.  The PLV branch is a small branch that does not appear to have any angiographic evidence of stenosis.  CONCLUSION:  Multivessel coronary artery disease as described above.      Abnormal thallium stress test 11/05/2016    Essential hypertension 09/22/2016    Family history of coronary artery disease 09/22/2016    Mixed dyslipidemia 09/08/2016    Observation for suspected cardiovascular disease 09/08/2016     01/28/2014  Exercise Stress exam   Conclusion:  No exercises induced chest pain, no ECG evidence of ischemia. PVC seen.  Hypertensive response to exercise  Medical thearpy with lifestyle modification.  Low risk study.      Chest pain 09/08/2016         Review of Systems   Constitutional: Positive for malaise/fatigue.   HENT: Negative.     Eyes: Negative.    Cardiovascular:  Positive for chest pain and dyspnea on exertion.   Respiratory: Negative.     Endocrine: Negative.    Hematologic/Lymphatic: Negative.    Skin: Negative.    Musculoskeletal:  Positive for back pain.   Gastrointestinal: Negative. Genitourinary: Negative.    Neurological:  Positive for light-headedness.   Psychiatric/Behavioral: Negative.     Allergic/Immunologic: Negative.        Physical Exam  GENERAL: The patient is well developed, well nourished, resting comfortably and in no distress.   HEENT: No abnormalities of the visible oro-nasopharynx, conjunctiva or sclera are noted.  NECK: There is no jugular venous distension. Carotids are palpable and without bruits. There is no thyroid enlargement.  Chest: Lung fields are clear to auscultation. There are no wheezes or crackles.   CV: There is a regular rhythm. The first and second heart  sounds are normal.  A soft systolic ejection murmur is heard.  There are no diastolic murmurs, gallops or rubs.  His apical heart rate is 60 bpm.  ABD: The abdomen is soft and supple with normal bowel sounds. There is no hepatosplenomegaly, ascites, tenderness, masses or bruits.  Neuro: There are no focal motor defects. Ambulation is normal. Cognitive function appears normal.  Ext: There is no edema or evidence of deep vein thrombosis. Peripheral pulses are satisfactory.    SKIN: There are no rashes and no cellulitis.  His back incision looks good.  PSYCH: The patient is calm, rationale and oriented.    Cardiovascular Studies  A twelve-lead ECG obtained on 02/10/2024 reveals mild sinus bradycardia with a heart rate of 57 bpm.  There is no evidence of myocardial ischemia or infarction.  Echo Doppler 01/11/2024:  Interpretation Summary  Technically difficult study due to suboptimal endocardial resolution.     Normal left ventricular size and low normal systolic function.  LVEF of 50-55.  Abnormal septal motion due to LBBB and postoperative state; otherwise, no regional wall motion abnormalities.  Grade I LV diastolic dysfunction.  Normal right ventricular size and borderline systolic function.  Mild right atrial dilatation.  Normal-sized left atrium.  Mild aortic sclerotic/calcified aortic valve without stenosis. Mild aortic regurgitation.  Estimated CVP 0-5 mmHg.  Estimated PASP of 30 mmHg.  No pericardial effusion.  Compared to prior study on 05/17/2023, biventricular systolic function appears less dynamic, but should note that Definity contrast was not utilized on the prior study and this may account for some of the difference in visualized contractility.  Estimated PASP is lower (previously 47 mmHg).    Cardiovascular Health Factors  Vitals BP Readings from Last 3 Encounters:   02/10/24 (!) 140/81   01/18/24 (!) 150/77   11/02/23 136/79     Wt Readings from Last 3 Encounters:   02/10/24 87 kg (191 lb 12.8 oz)   01/18/24 87.5 kg (193 lb)   11/02/23 93.9 kg (207 lb)     BMI Readings from Last 3 Encounters:   02/10/24 30.04 kg/m?   01/18/24 30.23 kg/m?   11/02/23 34.45 kg/m?      Smoking Social History     Tobacco Use   Smoking Status Never   Smokeless Tobacco Never      Lipid Profile Cholesterol   Date Value Ref Range Status   01/16/2024 64 <200 mg/dL Final     HDL   Date Value Ref Range Status   01/16/2024 22 (L) >40 mg/dL Final     LDL   Date Value Ref Range Status   01/16/2024 37 <100 mg/dL Final     Triglycerides   Date Value Ref Range Status   01/16/2024 68 <150 mg/dL Final      Blood Sugar Hemoglobin A1C   Date Value Ref Range Status   01/16/2024 5.9 (H) 4.0 - 5.7 % Final     Comment:     The ADA recommends that most patients with type 1 and type 2 diabetes maintain an A1c level <7%.     Glucose   Date Value Ref Range Status   01/25/2024 94 70 - 105 Final   01/18/2024 94 70 - 100 mg/dL Final   16/07/9603 99 70 - 100 mg/dL Final     Glucose, POC   Date Value Ref Range Status   12/02/2016 151 (H) 70 - 100 MG/DL Final   54/06/8118 147 (H) 70 - 100 MG/DL Final  12/01/2016 109 (H) 70 - 100 MG/DL Final          Problems Addressed Today  Angina pectoris.  Coronary disease.  Hypertension.  Hypercholesterolemia.  Cholelithiasis.    Assessment and Plan   1) Billy May reports slowly progressive exertional chest discomfort suggestive for angina pectoris.  It is not unstable but is interfering with his daily activities, such as shopping at department stores and carrying bags of feed.  I do not believe that he needs a stress test at this time since his diagnosis would still be progressive angina even if his stress test was negative or equivocal.  Alternatives for further diagnosis and treatment were reviewed with the patient and he wanted to proceed with coronary angiography/intervention.  I have asked that coronary angiography/intervention be scheduled as soon as possible.  I believe that his medical treatment is maximally titrated.  As a matter fact, his amlodipine  had to be decreased from 10 mg to 5 mg daily by another provider recently because of orthostasis.  I am hesitant to prescribe a beta-blocker since his resting heart rate on his ECG today was 57 bpm.  Billy May actually requested that his losartan  be decreased from 100 mg daily to 50 mg daily because of orthostasis and fatigue.  I have asked him to carry his nitroglycerin  with him at all times.  He wanted to start a small dose of Imdur  30 mg daily.  Hopefully he will be able to tolerate this without orthostasis.  We could even stop his losartan  in the future if this might help him tolerate Imdur .  I have asked him to restrict himself to mild activities until he undergoes coronary angiography/intervention.  I spent a long time alerting him to worrisome symptoms that would prompt more expedited evaluation and management of his coronary artery disease.  He was instructed to call 911 and to present emergently for evaluation and treatment should he experience any escalation of his chest discomfort or other worrisome symptoms.  2) I have asked Billy May to contact Dr. Denver Flaming, his gallbladder surgeon to keep him apprised of his progress.  3) I have asked Billy May to return for outpatient follow-up within 3 months to follow his progress. The total time spent during this interview and exam with preparation and chart review was 60 minutes.           Current Medications (including today's revisions)   allopurinol  (ZYLOPRIM ) 300 mg tablet Take one tablet by mouth daily. Take with food.    amLODIPine  (NORVASC ) 5 mg tablet Take one tablet by mouth twice daily. Change in dose.  Stop metoprolol . (Patient taking differently: Take one tablet by mouth daily. Change in dose.  Stop metoprolol .)    aspirin  EC 81 mg tablet Take one tablet by mouth at bedtime daily. Take with food.    gabapentin  (NEURONTIN ) 300 mg capsule Take one capsule by mouth daily.    levothyroxine  (SYNTHROID ) 50 mcg tablet Take one tablet by mouth daily 30 minutes before breakfast.    losartan  (COZAAR ) 100 mg tablet TAKE 1 TABLET BY MOUTH EVERY DAY    methocarbamoL (ROBAXIN) 750 mg tablet Take one tablet by mouth as Needed.    naproxen sodium (ALEVE) 220 mg tablet Take one tablet by mouth as Needed.    nitroglycerin  (NITROSTAT ) 0.4 mg tablet Place one tablet under tongue every 5 minutes as needed for Chest Pain. Max of 3 tablets, call 911.    pantoprazole  DR (PROTONIX ) 40 mg tablet Take one  tablet by mouth daily.    rosuvastatin  (CRESTOR ) 10 mg tablet Take one tablet by mouth daily.    tamsulosin  (FLOMAX ) 0.4 mg capsule Take one capsule by mouth at bedtime daily.    traZODone (DESYREL) 50 mg tablet Take one tablet by mouth at bedtime daily.

## 2024-02-11 ENCOUNTER — Encounter: Admit: 2024-02-11 | Discharge: 2024-02-11 | Payer: MEDICARE

## 2024-02-11 NOTE — Telephone Encounter
 Patient has upcoming appointment in optimization clinic in June. Concerned his gallbladder issues could be causing his heart issue. Seen in cardiovascular clinic yesterday. RN explained that we would want his cardiac condition to be stable. Commended patient on keeping cardiologist in loop as this helps speed along the surgery scheduling process.     Informed patient that 6/16 date is soonest available at this moment, but he is on a wait-list to be seen sooner. Patient verbalized understanding.

## 2024-02-15 ENCOUNTER — Encounter: Admit: 2024-02-15 | Discharge: 2024-02-15 | Payer: MEDICARE

## 2024-02-15 DIAGNOSIS — I10 Essential (primary) hypertension: Secondary | ICD-10-CM

## 2024-02-15 DIAGNOSIS — Z8249 Family history of ischemic heart disease and other diseases of the circulatory system: Secondary | ICD-10-CM

## 2024-02-15 DIAGNOSIS — I5032 Chronic diastolic (congestive) heart failure: Secondary | ICD-10-CM

## 2024-02-15 DIAGNOSIS — Z0389 Encounter for observation for other suspected diseases and conditions ruled out: Secondary | ICD-10-CM

## 2024-02-15 DIAGNOSIS — I2089 Stable angina pectoris: Secondary | ICD-10-CM

## 2024-02-15 DIAGNOSIS — E782 Mixed hyperlipidemia: Secondary | ICD-10-CM

## 2024-02-15 LAB — BASIC METABOLIC PANEL
ANION GAP: 9
CALCIUM: 8.7
GFR ESTIMATED: 61

## 2024-02-15 LAB — CBC
HEMATOCRIT: 37 — ABNORMAL LOW (ref 40.1–51.0)
HEMOGLOBIN: 12 — ABNORMAL LOW (ref 13.7–17.5)
MCH: 30
MCHC: 33
MCV: 90
MPV: 9 — ABNORMAL LOW (ref 9.4–12.4)
PLATELET COUNT: 201
RBC COUNT: 4 — ABNORMAL LOW (ref 4.63–6.08)
RDW: 14
WBC COUNT: 8.5

## 2024-02-18 ENCOUNTER — Encounter: Admit: 2024-02-18 | Discharge: 2024-02-18 | Payer: MEDICARE

## 2024-02-18 NOTE — Telephone Encounter
 Patient called nursing line today stating that he is unable to tolerate the Imdur 30mg  that was prescribed at 4/24 OV with Dr. Anda Bamberg. Patient states that he started with just headaches, but now he also states that he has developed fatigue that is intolerable. Patient states that blood pressures are averaging 100-110/50s with HR in the 60s.       Will route to SBG for review and recommendations.

## 2024-02-18 NOTE — Telephone Encounter
 Marja Sierra, MD to Me  (Selected Message)  02/18/24  4:19 PM  Billy May: Yes, certainly okay to stop Imdur if he is not able to tolerate it.  Please archive your note so that we will remember that he does not tolerate this medication.  Please ask him to restrict his activities until coronary angiography/intervention is performed.  Remind him to contact 911 for any emergencies.  Thanks.  SBG        Recommendations called to patient. Patient verbalized understanding and has no further questions or concerns at this time.

## 2024-02-21 ENCOUNTER — Encounter: Admit: 2024-02-21 | Discharge: 2024-02-21 | Payer: MEDICARE

## 2024-02-22 ENCOUNTER — Ambulatory Visit: Admit: 2024-02-22 | Discharge: 2024-02-23 | Payer: MEDICARE

## 2024-02-22 ENCOUNTER — Encounter: Admit: 2024-02-22 | Discharge: 2024-02-22 | Payer: MEDICARE

## 2024-02-22 DIAGNOSIS — I25118 Atherosclerotic heart disease of native coronary artery with other forms of angina pectoris: Secondary | ICD-10-CM

## 2024-02-22 MED ORDER — AMLODIPINE 5 MG PO TAB
ORAL_TABLET | ORAL | 3 refills | 90.00000 days | Status: AC
Start: 2024-02-22 — End: ?

## 2024-02-22 NOTE — Patient Instructions
 Thank you for visiting our office today.    We would like to make the following medication adjustments:      Increase amlodipine- take 5 mg (1 tablet) in the morning and 2.5mg  (1/2 tablet) in the evening       Otherwise continue the same medications as you have been doing.          We will be pursuing the following tests after your appointment today:       Orders Placed This Encounter    amLODIPine (NORVASC) 5 mg tablet     Monitor blood pressures regularly and call nursing line with readings    We will plan to see you back in July.  Please call us  in the meantime with any questions or concerns.        Please allow 5-7 business days for our providers to review your results. All normal results will go to MyChart. If you do not have Mychart, it is strongly recommended to get this so you can easily view all your results. If you do not have mychart, we will attempt to call you once with normal lab and testing results. If we cannot reach you by phone with normal results, we will send you a letter.  If you have not heard the results of your testing after one week please give us  a call.       Your Cardiovascular [facility] [place] Team Siegfried Dress, Towanda Fret, Prentice Brochure, and [place])  phone number is [phone].

## 2024-02-22 NOTE — Telephone Encounter
-----   Message from Lindbergh Reusing, MD sent at 02/21/2024  4:42 PM CDT -----  Regarding: RE: surgical clearance, medical management for CAD  Verdis Glade and Siegfried Dress: Mr. Swearengen underwent coronary angiography today but did not require intervention.  Please schedule him to see me in clinic in follow-up and I can review his operative risk with him.  Bailey: I would be happy to see him at the [facility] or [facility] if there is more timely availability.  Thanks to all.  SBG  ----- Message -----  From: Patsie Booty  Sent: 02/21/2024   3:42 PM CDT  To: Marja Sierra, MD; [facility] Nurse [facility] Card Team#  Subject: surgical clearance, medical management for#    Patient had cardiac catheterization today for evaluation of stable angina symptoms, which revealed his coronary bypass grafts are all patent.  Results were discussed with Dr. Anda Bamberg, who will plan on medical management.  He was recently started on Imdur 30 mg daily which he did not tolerate due to headaches and fatigue. Has hx orthostasis so Amlodipine dose has been reduced fairly recently->will leave it to Dr. Anda Bamberg about adding Ranexa or other medical management.Patient and family also asked about sending paperwork to Dr. Salley Crawford, general surgeon at [facility], regarding if patient can now proceed with gallbladder surgery. Can you please address this with Dr. Anda Bamberg?Thanks.

## 2024-02-22 NOTE — Progress Notes
 Date of Service: 02/22/2024    Billy May is a 73 y.o. male.       HPI   Billy May is followed for coronary artery disease, hypertension and hypercholesterolemia.  When I saw him on February 11, 2024 Billy May reported exertional chest discomfort that would come on when carrying heavy bags of animal feed or rushing in a department store. The exertional chest discomfort is a midsternal discomfort that feels like tightness and occurs only with activity.  This exertional chest discomfort is triggered by more than usual activity and stops promptly when he stops.  This sounded classic for angina pectoris.  The exertional discomfort was not unstable but was interfering with his daily activities.  Billy May did not tolerate beta-blocker therapy because of fatigue and lethargy and did not tolerate long-acting nitrates because of headaches.  He was referred for coronary angiography which was performed on 02/21/2024.  This revealed patent bypass grafts (see below).  No coronary intervention was required.  Since I saw Billy May on February 11, 2024 he has somewhat restricted his activities.  He reports no exertional chest discomfort or symptoms suggestive for angina since that time.  However, he also has a second type of discomfort that he can differentiate from his angina.  This is a more diffuse discomfort located in a horizontal band throughout his upper abdomen and is triggered strictly by eating, especially spicy foods and especially tomato sauce.  This discomfort may last for several hours and is not related to activity whatsoever.  Billy May can differentiate between the 2 discomforts.  The second discomfort may be related to his gallbladder disease.  It may occur several times a week depending upon what he eats.  Otherwise, the patient reports no congestive symptoms, palpitations, sensation of sustained forceful heart pounding, falls, presyncope or syncope.  His exercise tolerance has been stable, although he does not currently have an exercise routine.  He does have a treadmill at home that he does not use.  The patient reports no myalgias, claudication, bleeding abnormalities, or strokelike symptoms.    Historically, on November 27, 2016 he underwent coronary artery bypass times 4 (left internal mammary artery anastomosed to the LAD, reverse saphenous vein graft anastomosed in sequence to the high diagonal and circumflex marginal artery and reverse saphenous vein graft anastomosed to the posterior descending artery).  He had Covid in December 2020.  He describes this as a mild case with cough and low-grade temperature and some muscle aches.  He was seen by a physician who performed an x-ray and said that he had bronchitis without pneumonia.  He was treated with prednisone. He reports that he underwent back surgery on 04/15/2023 with laminectomy to L2-L5 without complications. On January 15, 2024 he was hospitalized for abdominal discomfort which was attributed to symptomatic cholelithiasis, possible recent choledocholithiasis.  It was thought that he most likely passed a gallstone.       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 Lela Purple  ANGIOGRAPHY:    The left main arises normally from the left coronary cusp and bifurcates into the LAD and the left circumflex artery.  The left main has about a 30% stenosis in its distal segment.    The LAD arises normally from the left main coronary artery.  The LAD is a type 3 vessel.  The LAD has diffuse 40% to 50% disease in its proximal, as well as in its mid section, and it is heavily calcified in the same area.  The LAD also has an 80% stenosis in its mid segment.  Distal to this, the LAD has about 20% to 30% stenosis in the mid section after the second diagonal branch.  The LAD gives off 3 diagonal branches.  The first of the 3 diagonal branches has about 50% to 60% stenosis in its proximal segment.     The circumflex artery arises normally from the left main coronary artery.  The left circumflex artery has 4-5 tandem lesions in its proximal to mid section, which are all varying from 70% to 90% stenosis.  It gives off an OM branch that does not appear to have any angiographic evidence of significant stenosis.  The RCA is a dominant vessel that arises normally from the right coronary cusp.  The RCA has about 50% stenosis in its ostial segment.  The mid to distal RCA is heavily calcified with diffuse disease of about 30% to 40%.  The PDA branch appears to have about 90% stenosis in its to distal segment.  The PLV branch is a small branch that does not appear to have any angiographic evidence of stenosis.  CONCLUSION:  Multivessel coronary artery disease as described above.      Abnormal thallium stress test 11/05/2016    Essential hypertension 09/22/2016    Family history of coronary artery disease 09/22/2016    Mixed dyslipidemia 09/08/2016    Observation for suspected cardiovascular disease 09/08/2016     01/28/2014  Exercise Stress exam Conclusion:  No exercises induced chest pain, no ECG evidence of ischemia. PVC seen.  Hypertensive response to exercise  Medical thearpy with lifestyle modification.  Low risk study.      Chest pain 09/08/2016         Review of Systems   Constitutional: Negative.   HENT: Negative.     Eyes: Negative.    Cardiovascular: Negative.    Respiratory: Negative.     Endocrine: Negative.    Hematologic/Lymphatic: Negative.    Skin: Negative.    Musculoskeletal: Negative.    Gastrointestinal: Negative.    Genitourinary: Negative.    Neurological: Negative.    Psychiatric/Behavioral: Negative.     Allergic/Immunologic: Negative.      Physical Exam  GENERAL:  The patient is well developed, well nourished, resting comfortably and in no distress.   HEENT: No abnormalities of the visible oro-nasopharynx, conjunctiva or sclera are noted.  NECK: There is no jugular venous distension. Carotids are palpable and without bruits. There is no thyroid enlargement.  Chest: Lung fields are clear to auscultation. There are no wheezes or crackles.   CV: There is a regular rhythm. The first and second heart sounds are normal.  A soft systolic ejection murmur is heard.  There are no diastolic murmurs, gallops or rubs.  His apical heart rate is 60 bpm.  ABD: The abdomen is soft and supple with normal bowel sounds. There is no hepatosplenomegaly, ascites, tenderness, masses or bruits.  Neuro: There are no focal motor defects. Ambulation is normal. Cognitive function appears normal.  Ext: There is no edema or evidence of deep vein thrombosis. Peripheral pulses are satisfactory.  His right femoral cath site from yesterday looks good without hematoma or bleeding.  SKIN: There are no rashes and no cellulitis.  His back incision looks good.  PSYCH: The patient is calm, rationale and oriented.    Cardiovascular Studies  A twelve-lead ECG obtained on February 10, 2024 revealed mild sinus bradycardia with a heart rate of 57 bpm.  There was no evidence of myocardial ischemia or infarction.  Echo Doppler 01/15/2024:  Interpretation Summary  Technically difficult study due to suboptimal endocardial resolution.     Normal left ventricular size and low normal systolic function.  LVEF of 50-55.  Abnormal septal motion due to LBBB and postoperative state; otherwise, no regional wall motion abnormalities.  Grade I LV diastolic dysfunction.  Normal right ventricular size and borderline systolic function.  Mild right atrial dilatation.  Normal-sized left atrium.  Mild aortic sclerotic/calcified aortic valve without stenosis.  Mild aortic regurgitation.  Estimated CVP 0-5 mmHg.  Estimated PASP of 30 mmHg.  No pericardial effusion.  Compared to prior study on 05/17/2023, biventricular systolic function appears less dynamic, but should note that Definity contrast was not utilized on the prior study and this may account for some of the difference in visualized contractility.  Estimated PASP is lower (previously 47 mmHg).    Coronary angiography 02/21/2024:  SELECTIVE CORONARY ANGIOGRAPHY:    Left main coronary artery:  The left main coronary arises from the left coronary sinus, it has a mild luminal irregularity in the distal segment.  It bifurcates into left anterior descending and left circumflex artery.  Left anterior descending artery:  The left anterior descending artery is the medium caliber vessel, which is a type 2 configuration.  It has 80% stenosis in the proximal segment and the mid segment.  It gives rise to 2 diagonal branches.  The 1st diagonal branch is a medium caliber vessel.  It has a 99% subtotal occlusion at ostium.  Competitive filling is seen in the LAD suggestive of patent LIMA graft and in the diagonal artery, suggestive of patent SVG graft.  Left circumflex artery:  The left circumflex artery is a 99% subtotal occlusion at the ostium, it has severe diffuse disease from the proximal segment extending in to the obtuse marginal branch.  Right coronary artery: The right coronary artery is a large dominant vessel, arises from the right coronary sinus, it bifurcates into right posterior descending artery and divides posterolateral ventricular branch.  The ostium of RCA has a 50-60% stenosis. RCA has a diffuse mild to moderate diffuse disease with a multiple lumpy bumpy disease throughout the course.  Distal RCA  before bifurcation has a 90% stenosis.  PDA and PLV are visualized on the venous graft angiogram.     SELECTIVE BYPASS GRAFT ANGIOGRAPHY:    LIMA to LAD:  The LIMA to LAD graft was visualized using IMA diagnostic catheter.  The ostium, body and anastomotic site of the LIMA graft is angiographically normal.  LIMA supplies medium caliber LAD artery.  Post anastomotic site, the LAD is angiographically normal.  Sequential SVG to diagonal marginal graft:  It was engaged using JR4 diagnostic catheter.  The ostium, body and anastomotic site of the graft is angiographically normal.  It supplies the medium caliber diagonal branch and the small caliber marginal branch.  Post anastomotic site, the diagonal and the marginal branch are angiographically normal.  Graft retrogradely fills the diagonal branch and goes all the way up to the ostium but does not fill the LAD.  The venous graft fills the marginal branch retrogradely up to the mid segment where it is totally occluded..  SVG to right PDA graft:  It was engaged using multipurpose catheter.  The ostium, body and anastomotic sites of the graft was angiographically normal.  The post anastomosis PDA artery is angiographically normal.  The graft retrogradely fills PDA and antegradely fills the PLV branches.  They are angiographically normal.     FINAL IMPRESSION:    Multivessel obstructive coronary artery disease involving:  90% distal RCA stenosis.  99% subtotal occlusion of the ostium of left circumflex artery.  80% stenosis of proximal and mid LAD.  Patent LIMA to LAD graft.  Patent sequential SVG to diagonal marginal graft.  Patent SVG to right PDA graft.  Closure of the right common femoral artery access site with Mynx control closure device.     RECOMMENDATIONS:    Continue aggressive medical management of the coronary artery disease risk factor.  Consider evaluation of alternative etiologies of the patient's chest pain.    Cardiovascular Health Factors  Vitals BP Readings from Last 3 Encounters:   02/22/24 (!) 140/78   02/21/24 124/65   02/10/24 (!) 140/81     Wt Readings from Last 3 Encounters:   02/22/24 86.3 kg (190 lb 3.2 oz)   02/21/24 86 kg (189 lb 9.6 oz)   02/10/24 87 kg (191 lb 12.8 oz)     BMI Readings from Last 3 Encounters:   02/22/24 29.79 kg/m?   02/21/24 29.70 kg/m?   02/10/24 30.04 kg/m?      Smoking Social History     Tobacco Use   Smoking Status Never   Smokeless Tobacco Never      Lipid Profile Cholesterol   Date Value Ref Range Status   01/16/2024 64 <200 mg/dL Final     HDL   Date Value Ref Range Status   01/16/2024 22 (L) >40 mg/dL Final     LDL   Date Value Ref Range Status   01/16/2024 37 <100 mg/dL Final     Triglycerides   Date Value Ref Range Status   01/16/2024 68 <150 mg/dL Final      Blood Sugar Hemoglobin A1C   Date Value Ref Range Status   01/16/2024 5.9 (H) 4.0 - 5.7 % Final     Comment:     The ADA recommends that most patients with type 1 and type 2 diabetes maintain an A1c level <7%.     Glucose   Date Value Ref Range Status   02/15/2024 109 (H) 70 - 105 Final   01/25/2024 94 70 -  105 Final   01/18/2024 94 70 - 100 mg/dL Final     Glucose, POC   Date Value Ref Range Status   12/02/2016 151 (H) 70 - 100 MG/DL Final   16/07/9603 540 (H) 70 - 100 MG/DL Final   98/08/9146 829 (H) 70 - 100 MG/DL Final          Problems Addressed Today  Coronary artery disease.  Hypercholesterolemia.  Angina.    Assessment and Plan   When I saw Mr. Schlafer on 02/11/2024 he reported several episodes of exertional angina that was provoked by greater than usual activity.  He has had no recurrent angina or anginal variant symptoms since that time.  He underwent coronary angiography on 02/21/2024 that showed patent grafts and no coronary intervention was required.  Even though Mr. Thierry is experience no recent angina, alternatives for treatment were reviewed reviewed with the patient and he wanted to increase his amlodipine from 5 mg daily to 5 mg in the morning and 2.5 mg in the afternoon.  Amlodipine can be a very good antianginal medication.  Perhaps this will reduce angina provoked by greater than usual activity. I have asked the patient to carry sublingual nitroglycerin at all times.  His LDL cholesterol appears well-controlled but we may consider increasing his dose of rosuvastatin in the future.    Mr. Gudmundson continues to report abdominal discomfort triggered by certain foods and meals.  This abdominal discomfort is not suggestive for angina or an anginal variant.  Concerning gallbladder surgery: Based upon the results of the patient's clinical profile, non-invasive cardiovascular testing, and invasive coronary angiography the risk for cardiovascular morbidity and mortality associated with non-cardiac surgery is ELEVATED. This by no means precludes surgery but provides information for the surgeon, anesthesiologist and patient concerning the risks of surgery.  A decision to proceed with surgery should be made based upon the relative benefits and risks involved. Indeed, gallbladder surgery could be a very helpful intervention for this patient's overall health maintenance by decreasing the likelihood for complications related to gallbladder disease.  Complications related to gallbladder disease could adversely impact his cardiovascular stability.  There are no absolute contra-indications to elective surgery, as deemed necessary. No additional cardiovascular testing is required at this time.  Concerning the perioperative use of aspirin, the patient will have to accept the risk of holding his aspirin if required by his surgical team.  If his aspirin is held in the perioperative period, then I would recommend holding it for as short of a period as possible, restarting his aspirin as soon as his bleeding risk is no longer prohibitive.  I have asked the patient to return for follow-up in approximate 3 months to follow his progress. The total time spent during this interview and exam with preparation and chart review was 60 minutes.         Current Medications (including today's revisions)   allopurinol (ZYLOPRIM) 300 mg tablet Take one tablet by mouth daily. Take with food.    amLODIPine (NORVASC) 5 mg tablet Take 5mg  (1 tablet) in the morning and 2.5mg  (1/2tablet) in the evening    aspirin EC 81 mg tablet Take one tablet by mouth at bedtime daily. Take with food.    gabapentin (NEURONTIN) 300 mg capsule Take one capsule by mouth daily.    levothyroxine (SYNTHROID) 50 mcg tablet Take one tablet by mouth daily 30 minutes before breakfast.    losartan (COZAAR) 50 mg tablet Take one tablet by mouth daily.  methocarbamoL (ROBAXIN) 750 mg tablet Take one tablet by mouth as Needed.    naproxen sodium (ALEVE) 220 mg tablet Take one tablet by mouth as Needed.    nitroglycerin (NITROSTAT) 0.4 mg tablet Place one tablet under tongue every 5 minutes as needed for Chest Pain. Max of 3 tablets, call 911.    pantoprazole DR (PROTONIX) 40 mg tablet Take one tablet by mouth daily.    rosuvastatin (CRESTOR) 10 mg tablet Take one tablet by mouth daily.    tamsulosin (FLOMAX) 0.4 mg capsule Take one capsule by mouth at bedtime daily.    traZODone (DESYREL) 50 mg tablet Take one tablet by mouth at bedtime daily.

## 2024-02-29 ENCOUNTER — Encounter: Admit: 2024-02-29 | Discharge: 2024-02-29 | Payer: MEDICARE

## 2024-02-29 NOTE — Progress Notes
 Patient presented to office today to have his home blood pressure cuff compared to office reading. Patient's BP with office cuff on left arm was 128/70 with HR 75 and BP with home cuff was 121/70 HR 73 on left arm.  Patient would like to know if he could do cardiac rehab at the [facility]. Will route to Dr. Anda Bamberg for his review.

## 2024-03-17 ENCOUNTER — Encounter: Admit: 2024-03-17 | Discharge: 2024-03-17 | Payer: MEDICARE

## 2024-03-17 NOTE — Progress Notes
 Received a call from [facility] requesting clinical notes to support order for cardiac rehab referral. Patient's last ov note from visit with Dr. Anda Bamberg on 02/22/24 faxed to [facility] at [phone].

## 2024-03-20 ENCOUNTER — Encounter: Admit: 2024-03-20 | Discharge: 2024-03-20 | Payer: MEDICARE

## 2024-03-20 MED ORDER — AMLODIPINE 5 MG PO TAB
5 mg | ORAL_TABLET | Freq: Every day | ORAL | 3 refills | 90.00000 days | Status: AC
Start: 2024-03-20 — End: ?

## 2024-03-20 NOTE — Telephone Encounter
 Patient called nursing line today to report over the past couple of weeks that he has been experiencing low blood pressures. He states that he has been experiencing some fatigue and weakness. He denies any chest pain, shortness of breath, or palpitations. He states that he has not made any other medication changes. He states that he had his gallbladder out about 3 weeks ago and he was just cleared from his surgeon last week with no post operative complications. Patient gave the following list of blood pressures:     87/54 HR 70  86/42 HR 67  88/51 HR 61  102/62 HR 67  82/52 HR 79  108/76 HR 67  97/58 HR 72  96/58 HR 71  118/64 HR 62  101/54 HR 64  78/47 HR 47  90/52 HR 66  83/48 HR 74  108/59 HR 66  95/78 HR 64  79/50 HR 79  92/51 HR 74

## 2024-03-20 NOTE — Telephone Encounter
 Reviewed blood pressures and heart rates with Dr. Anda Bamberg. He recommends for patient to stop evening dose of Amlodipine 2.5mg . He would also like to see Davyd in the office 6/5.       Called and discussed Dr. Lindbergh Reusing recommendations with patient. Patient will continue to monitor blood pressures. Patient verbalized understanding and has no further questions or concerns. Patient verified D-T-L of upcoming appointment with Dr. Anda Bamberg.

## 2024-03-23 ENCOUNTER — Encounter: Admit: 2024-03-23 | Discharge: 2024-03-23 | Payer: MEDICARE

## 2024-03-23 ENCOUNTER — Ambulatory Visit: Admit: 2024-03-23 | Discharge: 2024-03-23 | Payer: MEDICARE

## 2024-03-23 DIAGNOSIS — I2510 Atherosclerotic heart disease of native coronary artery without angina pectoris: Secondary | ICD-10-CM

## 2024-03-23 MED ORDER — AMLODIPINE 2.5 MG PO TAB
2.5 mg | ORAL_TABLET | Freq: Every day | ORAL | 3 refills | 90.00000 days | Status: AC
Start: 2024-03-23 — End: ?

## 2024-03-23 NOTE — Progress Notes
 Date of Service: 03/23/2024    Tajay Muzzy is a 73 y.o. male.       HPI   Mr. Ozga is followed for coronary artery disease, hypertension and hypercholesterolemia.  When I saw him on February 11, 2024 Mr. Stroschein reported exertional chest discomfort that would come on when carrying heavy bags of animal feed or rushing in a department store.  Mr. Milam did not tolerate beta-blocker therapy because of fatigue and lethargy and did not tolerate long-acting nitrates because of headaches.  He was referred for coronary angiography which was performed on 02/21/2024.  This revealed patent bypass grafts (see below).  No coronary intervention was required.  When I saw him on Feb 23, 2024 his amlodipine was increased to 5 mg in the morning and 2.5 mg in the afternoon for its antianginal properties.  This caused some orthostasis and his amlodipine was reduced to 5 mg daily.  He is still having some orthostasis but it is mild and resolves after 60 seconds.  He has been monitoring his blood pressure at home and his blood pressure has been low with most systolic blood pressure readings in the range of 100-120 mmHg. Mr. Ziomek reports that he underwent cholecystectomy on Feb 24, 2024 without complication.  Since he underwent cholecystectomy he has had no chest discomfort or abdominal discomfort whatsoever.  Otherwise, the patient reports no angina, congestive symptoms, palpitations, sensation of sustained forceful heart pounding, falls, presyncope or syncope.  His exercise tolerance has been stable, although he does not currently have an exercise routine.  He does have a treadmill at home that he does not use.  The patient reports no myalgias, claudication, bleeding abnormalities, or strokelike symptoms.    Historically, on November 27, 2016 he underwent coronary artery bypass times 4 (left internal mammary artery anastomosed to the LAD, reverse saphenous vein graft anastomosed in sequence to the high diagonal and circumflex marginal artery and reverse saphenous vein graft anastomosed to the posterior descending artery).  He had Covid in December 2020.  He describes this as a mild case with cough and low-grade temperature and some muscle aches.  He was seen by a physician who performed an x-ray and said that he had bronchitis without pneumonia.  He was treated with prednisone. He reports that he underwent back surgery on 04/15/2023 with laminectomy to L2-L5 without complications. On January 15, 2024 he was hospitalized for abdominal discomfort which was attributed to symptomatic cholelithiasis, possible recent choledocholithiasis.  It was thought that he most likely passed a gallstone.       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 Lela Purple  ANGIOGRAPHY:    The left main arises normally from the left coronary cusp and bifurcates into the LAD and the left circumflex artery.  The left main has about a 30% stenosis in its distal segment.    The LAD arises normally from the left main coronary artery.  The LAD is a type 3 vessel.  The LAD has diffuse 40% to 50% disease in its proximal, as well as in its mid section, and it is heavily calcified in the same area.  The LAD also has an 80% stenosis in its mid segment.  Distal to this, the LAD has about 20% to 30% stenosis in the mid section after the second diagonal branch.  The LAD gives off 3 diagonal branches.  The first of the 3 diagonal branches has about 50% to 60% stenosis in its proximal segment.     The circumflex artery arises normally from the left main coronary artery.  The left circumflex artery has 4-5 tandem lesions in its proximal to mid section, which are all varying from 70% to 90% stenosis.  It gives off an OM branch that does not appear to have any angiographic evidence of significant stenosis.  The RCA is a dominant vessel that arises normally from the right coronary cusp.  The RCA has about 50% stenosis in its ostial segment.  The mid to distal RCA is heavily calcified with diffuse disease of about 30% to 40%.  The PDA branch appears to have about 90% stenosis in its to distal segment.  The PLV branch is a small branch that does not appear to have any angiographic evidence of stenosis.  CONCLUSION:  Multivessel coronary artery disease as described above.      Abnormal thallium stress test 11/05/2016    Essential hypertension 09/22/2016    Family history of coronary artery disease 09/22/2016    Mixed dyslipidemia 09/08/2016    Observation for suspected cardiovascular disease 09/08/2016     01/28/2014  Exercise Stress exam   Conclusion:  No exercises induced chest pain, no ECG evidence of ischemia. PVC seen.  Hypertensive response to exercise  Medical thearpy with lifestyle modification.  Low risk study.      Chest pain 09/08/2016         Review of Systems   Constitutional: Positive for malaise/fatigue.   HENT: Negative.     Eyes: Negative.    Cardiovascular: Negative.    Respiratory: Negative.     Endocrine: Negative.    Hematologic/Lymphatic: Negative. Skin: Negative.    Musculoskeletal: Negative.    Gastrointestinal: Negative.    Genitourinary: Negative.    Neurological:  Positive for light-headedness.   Psychiatric/Behavioral: Negative.     Allergic/Immunologic: Negative.        Physical Exam  GENERAL: The patient is well developed, well nourished, resting comfortably and in no distress.   HEENT: No abnormalities of the visible oro-nasopharynx, conjunctiva or sclera are noted.  NECK: There is no jugular venous distension. Carotids are palpable and without bruits. There is no thyroid enlargement.  Chest: Lung fields are clear to auscultation. There are no wheezes or crackles.  CV: There is a regular rhythm. The first and second heart sounds are normal.  A soft systolic ejection murmur is heard.  There are no diastolic murmurs, gallops or rubs.  His apical heart rate is 60 bpm.  ABD: The abdomen is soft and supple with normal bowel sounds. There is no hepatosplenomegaly, ascites, tenderness, masses or bruits.  Neuro: There are no focal motor defects. Ambulation is normal. Cognitive function appears normal.  Ext: There is no edema or evidence of deep vein thrombosis. Peripheral pulses are satisfactory.  His right femoral cath site from yesterday looks good without hematoma or bleeding.  SKIN: There are no rashes and no cellulitis.  His back incision looks good.  PSYCH: The patient is calm, rationale and oriented.    Cardiovascular Studies  A twelve-lead ECG obtained on February 10, 2024 revealed mild sinus bradycardia with a heart rate of 57 bpm.  There was no evidence of myocardial ischemia or infarction.  Echo Doppler 01/15/2024:  Interpretation Summary  Technically difficult study due to suboptimal endocardial resolution.     Normal left ventricular size and low normal systolic function.  LVEF of 50-55.  Abnormal septal motion due to LBBB and postoperative state; otherwise, no regional wall motion abnormalities.  Grade I LV diastolic dysfunction.  Normal right ventricular size and borderline systolic function.  Mild right atrial dilatation.  Normal-sized left atrium.  Mild aortic sclerotic/calcified aortic valve without stenosis.  Mild aortic regurgitation.  Estimated CVP 0-5 mmHg.  Estimated PASP of 30 mmHg.  No pericardial effusion.  Compared to prior study on 05/17/2023, biventricular systolic function appears less dynamic, but should note that Definity contrast was not utilized on the prior study and this may account for some of the difference in visualized contractility.  Estimated PASP is lower (previously 47 mmHg).     Coronary angiography 02/21/2024:  SELECTIVE CORONARY ANGIOGRAPHY:    Left main coronary artery:  The left main coronary arises from the left coronary sinus, it has a mild luminal irregularity in the distal segment.  It bifurcates into left anterior descending and left circumflex artery.  Left anterior descending artery:  The left anterior descending artery is the medium caliber vessel, which is a type 2 configuration.  It has 80% stenosis in the proximal segment and the mid segment.  It gives rise to 2 diagonal branches.  The 1st diagonal branch is a medium caliber vessel.  It has a 99% subtotal occlusion at ostium.  Competitive filling is seen in the LAD suggestive of patent LIMA graft and in the diagonal artery, suggestive of patent SVG graft.  Left circumflex artery:  The left circumflex artery is a 99% subtotal occlusion at the ostium, it has severe diffuse disease from the proximal segment extending in to the obtuse marginal branch.  Right coronary artery:  The right coronary artery is a large dominant vessel, arises from the right coronary sinus, it bifurcates into right posterior descending artery and divides posterolateral ventricular branch.  The ostium of RCA has a 50-60% stenosis. RCA has a diffuse mild to moderate diffuse disease with a multiple lumpy bumpy disease throughout the course.  Distal RCA before bifurcation has a 90% stenosis. PDA and PLV are visualized on the venous graft angiogram.     SELECTIVE BYPASS GRAFT ANGIOGRAPHY:    LIMA to LAD:  The LIMA to LAD graft was visualized using IMA diagnostic catheter.  The ostium, body and anastomotic site of the LIMA graft is angiographically normal.  LIMA supplies medium  caliber LAD artery.  Post anastomotic site, the LAD is angiographically normal.  Sequential SVG to diagonal marginal graft:  It was engaged using JR4 diagnostic catheter.  The ostium, body and anastomotic site of the graft is angiographically normal.  It supplies the medium caliber diagonal branch and the small caliber marginal branch.  Post anastomotic site, the diagonal and the marginal branch are angiographically normal.  Graft retrogradely fills the diagonal branch and goes all the way up to the ostium but does not fill the LAD.  The venous graft fills the marginal branch retrogradely up to the mid segment where it is totally occluded..  SVG to right PDA graft:  It was engaged using multipurpose catheter.  The ostium, body and anastomotic sites of the graft was angiographically normal.  The post anastomosis PDA artery is angiographically normal.  The graft retrogradely fills PDA and antegradely fills the PLV branches.  They are angiographically normal.     FINAL IMPRESSION:    Multivessel obstructive coronary artery disease involving:  90% distal RCA stenosis.  99% subtotal occlusion of the ostium of left circumflex artery.  80% stenosis of proximal and mid LAD.  Patent LIMA to LAD graft.  Patent sequential SVG to diagonal marginal graft.  Patent SVG to right PDA graft.  Closure of the right common femoral artery access site with Mynx control closure device.     RECOMMENDATIONS:    Continue aggressive medical management of the coronary artery disease risk factor.  Consider evaluation of alternative etiologies of the patient's chest pain.    Cardiovascular Health Factors  Vitals BP Readings from Last 3 Encounters:   03/23/24 136/74   02/29/24 121/70   02/22/24 (!) 140/78     Wt Readings from Last 3 Encounters:   03/23/24 85.9 kg (189 lb 6.4 oz)   02/22/24 86.3 kg (190 lb 3.2 oz)   02/21/24 86 kg (189 lb 9.6 oz)     BMI Readings from Last 3 Encounters:   03/23/24 29.66 kg/m?   02/22/24 29.79 kg/m?   02/21/24 29.70 kg/m?      Smoking Social History     Tobacco Use   Smoking Status Never   Smokeless Tobacco Never      Lipid Profile Cholesterol   Date Value Ref Range Status   01/16/2024 64 <200 mg/dL Final     HDL   Date Value Ref Range Status   01/16/2024 22 (L) >40 mg/dL Final     LDL   Date Value Ref Range Status   01/16/2024 37 <100 mg/dL Final     Triglycerides   Date Value Ref Range Status   01/16/2024 68 <150 mg/dL Final      Blood Sugar Hemoglobin A1C   Date Value Ref Range Status   01/16/2024 5.9 (H) 4.0 - 5.7 % Final     Comment:     The ADA recommends that most patients with type 1 and type 2 diabetes maintain an A1c level <7%.     Glucose   Date Value Ref Range Status   02/15/2024 109 (H) 70 - 105 Final   01/25/2024 94 70 - 105 Final   01/18/2024 94 70 - 100 mg/dL Final     Glucose, POC   Date Value Ref Range Status   12/02/2016 151 (H) 70 - 100 MG/DL Final   78/46/9629 528 (H) 70 - 100 MG/DL Final   41/32/4401 027 (H) 70 - 100 MG/DL Final  Problems Addressed Today  Coronary artery disease.  Hypercholesterolemia.  Hypertension.    Assessment and Plan     Mr. Ishaq generally feels well and reports no angina or congestive symptoms.  Mr. Groft feels that cholecystectomy has led to resolution of all of his chest and abdominal symptoms.  He is still experiencing mild orthostasis with low blood pressure readings when checked at home and wants to decrease his amlodipine to 2.5 mg daily.  Cardiovascular risk factor modification was reviewed in detail.  I have asked him to return for follow-up within the next 3 months. The total time spent during this interview and exam with preparation and chart review was 30 minutes. Current Medications (including today's revisions)   allopurinol (ZYLOPRIM) 300 mg tablet Take one tablet by mouth daily. Take with food.    amLODIPine (NORVASC) 2.5 mg tablet Take one tablet by mouth daily.    aspirin EC 81 mg tablet Take one tablet by mouth at bedtime daily. Take with food.    gabapentin (NEURONTIN) 300 mg capsule Take one capsule by mouth daily.    levothyroxine (SYNTHROID) 50 mcg tablet Take one tablet by mouth daily 30 minutes before breakfast.    losartan (COZAAR) 50 mg tablet Take one tablet by mouth daily.    naproxen sodium (ALEVE) 220 mg tablet Take one tablet by mouth as Needed.    nitroglycerin (NITROSTAT) 0.4 mg tablet Place one tablet under tongue every 5 minutes as needed for Chest Pain. Max of 3 tablets, call 911.    pantoprazole DR (PROTONIX) 40 mg tablet Take one tablet by mouth daily.    rosuvastatin (CRESTOR) 10 mg tablet Take one tablet by mouth daily.    tamsulosin (FLOMAX) 0.4 mg capsule Take one capsule by mouth at bedtime daily.    traZODone (DESYREL) 50 mg tablet Take one tablet by mouth at bedtime daily.

## 2024-03-23 NOTE — Patient Instructions
 Decrease amlodipine to 2.5mg  daily  Continue to monitor blood pressure pulse  Call us  in 1 week with report of blood pressures and symptoms    Follow up as directed.  Call sooner if issues.  Call the [place] nursing line at [phone].  Leave a detailed message for the nurse in [facility] with how we can assist you and we will call you back.

## 2024-03-30 ENCOUNTER — Encounter: Admit: 2024-03-30 | Discharge: 2024-03-30 | Payer: MEDICARE

## 2024-03-30 NOTE — Progress Notes
 Patient states decreased dose of norvasc has helped with dizziness.  Readings below.

## 2024-04-14 ENCOUNTER — Encounter: Admit: 2024-04-14 | Discharge: 2024-04-14 | Payer: MEDICARE

## 2024-04-14 NOTE — Progress Notes
-    Spoke to the pt who states that he has already had his Gall Bladder removed in mid 02/2024 at [facility].

## 2024-04-20 ENCOUNTER — Encounter: Admit: 2024-04-20 | Discharge: 2024-04-20 | Payer: MEDICARE

## 2024-04-20 DIAGNOSIS — Z136 Encounter for screening for cardiovascular disorders: Principal | ICD-10-CM

## 2024-04-20 NOTE — Progress Notes
 Date of Service: 04/20/2024    Billy May is a 73 y.o. male.       HPI   Billy May is followed for coronary artery disease, hypertension and hypercholesterolemia.  When I saw him on February 11, 2024 Billy May reported exertional chest discomfort that would come on when carrying heavy bags of animal feed or rushing in a department store.  Billy May did not tolerate beta-blocker therapy because of fatigue and lethargy and did not tolerate long-acting nitrates because of headaches.  He was referred for coronary angiography which was performed on 02/21/2024.  This revealed patent bypass grafts (see below).  No coronary intervention was required.  He has been monitoring his blood pressure at home and his blood pressure has been very well-controlled.  Billy May reports that he underwent cholecystectomy on Feb 24, 2024 without complication.  Since he underwent cholecystectomy he has had no chest discomfort or abdominal discomfort whatsoever.  His only complaint today is left knee discomfort and he reports that he is being evaluated for left total knee arthroplasty.  Otherwise, the patient reports no angina, congestive symptoms, palpitations, sensation of sustained forceful heart pounding, falls, presyncope or syncope.  His exercise tolerance has been stable, and he has been participating in maintenance phase cardiac rehab without any difficulty whatsoever.  He does have a treadmill at home that he does not use.  The patient reports no myalgias, claudication, bleeding abnormalities, or strokelike symptoms.    Historically, on November 27, 2016 he underwent coronary artery bypass times 4 (left internal mammary artery anastomosed to the LAD, reverse saphenous vein graft anastomosed in sequence to the high diagonal and circumflex marginal artery and reverse saphenous vein graft anastomosed to the posterior descending artery).  He had Covid in December 2020.  He describes this as a mild case with cough and low-grade temperature and some muscle aches.  He was seen by a physician who performed an x-ray and said that he had bronchitis without pneumonia.  He was treated with prednisone. He reports that he underwent back surgery on 04/15/2023 with laminectomy to L2-L5 without complications. On January 15, 2024 he was hospitalized for abdominal discomfort which was attributed to symptomatic cholelithiasis, possible recent choledocholithiasis.  It was thought that he most likely passed a gallstone.       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 Freeman  ANGIOGRAPHY:    The left main arises normally from the left coronary cusp and bifurcates into the LAD and the left circumflex artery.  The left main has about a 30% stenosis in its distal segment.    The LAD arises normally from the left main coronary artery.  The LAD is a type 3 vessel.  The LAD has diffuse 40% to 50% disease in its proximal, as well as in its mid section, and it is heavily calcified in the same area.  The LAD also has an 80% stenosis in its mid segment.  Distal to this, the LAD has about 20% to 30% stenosis in the mid section after the second diagonal branch.  The LAD gives off 3 diagonal branches.  The first of the 3 diagonal branches has about 50% to 60% stenosis in its proximal segment.     The circumflex artery arises normally from the left main coronary artery.  The left circumflex artery has 4-5 tandem lesions in its proximal to mid section, which are all varying from 70% to 90% stenosis.  It gives off an OM branch that does not appear to have any angiographic evidence of significant stenosis.  The RCA is a dominant vessel that arises normally from the right coronary cusp.  The RCA has about 50% stenosis in its ostial segment.  The mid to distal RCA is heavily calcified with diffuse disease of about 30% to 40%.  The PDA branch appears to have about 90% stenosis in its to distal segment.  The PLV branch is a small branch that does not appear to have any angiographic evidence of stenosis.  CONCLUSION:  Multivessel coronary artery disease as described above.      Abnormal thallium stress test 11/05/2016    Essential hypertension 09/22/2016    Family history of coronary artery disease 09/22/2016    Mixed dyslipidemia 09/08/2016    Observation for suspected cardiovascular disease 09/08/2016     01/28/2014  Exercise Stress exam   Conclusion:  No exercises induced chest pain, no ECG evidence of ischemia. PVC seen.  Hypertensive response to exercise  Medical thearpy with lifestyle modification.  Low risk study.      Chest pain 09/08/2016         Review of Systems   Constitutional: Negative.   HENT: Negative.     Eyes: Negative.    Cardiovascular: Negative.    Respiratory: Negative.     Endocrine: Negative.    Hematologic/Lymphatic: Negative.    Skin: Negative.    Musculoskeletal: Negative.    Gastrointestinal: Negative.    Genitourinary: Negative.    Neurological: Negative.    Psychiatric/Behavioral: Negative.     Allergic/Immunologic: Negative. Physical Exam  GENERAL: The patient is well developed, well nourished, resting comfortably and in no distress.   HEENT: No abnormalities of the visible oro-nasopharynx, conjunctiva or sclera are noted.  NECK: There is no jugular venous distension. Carotids are palpable and without bruits. There is no thyroid enlargement.  Chest: Lung fields are clear to auscultation. There are no wheezes or crackles.   CV: There is a regular rhythm. The first and second heart sounds are normal.  A soft systolic ejection murmur is heard.  There are no diastolic murmurs, gallops or rubs.  His apical heart rate is 60 bpm.  ABD: The abdomen is soft and supple  with normal bowel sounds. There is no hepatosplenomegaly, ascites, tenderness, masses or bruits.  Neuro: There are no focal motor defects. Ambulation is normal. Cognitive function appears normal.  Ext: There is no edema or evidence of deep vein thrombosis. Peripheral pulses are satisfactory.  His right femoral cath site from yesterday looks good without hematoma or bleeding.  SKIN: There are no rashes and no cellulitis.    PSYCH: The patient is calm, rationale and oriented.    Cardiovascular Studies  A twelve-lead ECG obtained on 04/20/2024 reveals mild sinus bradycardia with a heart rate of 59 bpm.  Incomplete right bundle branch block is noted with QRS = 96 ms.  There is no evidence of myocardial ischemia or infarction.  Echo Doppler 01/15/2024:  Interpretation Summary  Technically difficult study due to suboptimal endocardial resolution.     Normal left ventricular size and low normal systolic function.  LVEF of 50-55.  Abnormal septal motion due to LBBB and postoperative state; otherwise, no regional wall motion abnormalities.  Grade I LV diastolic dysfunction.  Normal right ventricular size and borderline systolic function.  Mild right atrial dilatation.  Normal-sized left atrium.  Mild aortic sclerotic/calcified aortic valve without stenosis.  Mild aortic regurgitation.  Estimated CVP 0-5 mmHg.  Estimated PASP of 30 mmHg.  No pericardial effusion.  Compared to prior study on 05/17/2023, biventricular systolic function appears less dynamic, but should note that Definity  contrast was not utilized on the prior study and this may account for some of the difference in visualized contractility.  Estimated PASP is lower (previously 47 mmHg).     Coronary angiography 02/21/2024:  SELECTIVE CORONARY ANGIOGRAPHY:    Left main coronary artery:  The left main coronary arises from the left coronary sinus, it has a mild luminal irregularity in the distal segment.  It bifurcates into left anterior descending and left circumflex artery.  Left anterior descending artery:  The left anterior descending artery is the medium caliber vessel, which is a type 2 configuration.  It has 80% stenosis in the proximal segment and the mid segment.  It gives rise to 2 diagonal branches.  The 1st diagonal branch is a medium caliber vessel.  It has a 99% subtotal occlusion at ostium.  Competitive filling is seen in the LAD suggestive of patent LIMA graft and in the diagonal artery, suggestive of patent SVG graft.  Left circumflex artery:  The left circumflex artery is a 99% subtotal occlusion at the ostium, it has severe diffuse disease from the proximal segment extending in to the obtuse marginal branch.  Right coronary artery:  The right coronary artery is a large dominant vessel, arises from the right coronary sinus, it bifurcates into right posterior descending artery and divides posterolateral ventricular branch.  The ostium of RCA has a 50-60% stenosis. RCA has a diffuse mild to moderate diffuse disease with a multiple lumpy bumpy disease throughout the course.  Distal RCA before bifurcation has a 90% stenosis.  PDA and PLV are visualized on the venous graft angiogram.     SELECTIVE BYPASS GRAFT ANGIOGRAPHY:    LIMA to LAD:  The LIMA to LAD graft was visualized using IMA diagnostic catheter.  The ostium, body and anastomotic site of the LIMA graft is angiographically normal.  LIMA supplies medium caliber LAD artery.  Post anastomotic site, the LAD is angiographically normal.  Sequential SVG to diagonal marginal graft:  It was engaged using JR4 diagnostic catheter.  The ostium, body and anastomotic site of the graft is angiographically  normal.  It supplies the medium caliber diagonal branch and the small caliber marginal branch.  Post anastomotic site, the diagonal and the marginal branch are angiographically normal.  Graft retrogradely fills the diagonal branch and goes all the way up to the ostium but does not fill the LAD.  The venous graft fills the marginal branch retrogradely up to the mid segment where it is totally occluded..  SVG to right PDA graft:  It was engaged using multipurpose catheter.  The ostium, body and anastomotic sites of the graft was angiographically normal.  The post anastomosis PDA artery is angiographically normal.  The graft retrogradely fills PDA and antegradely fills the PLV branches.  They are angiographically normal.     FINAL IMPRESSION:    Multivessel obstructive coronary artery disease involving:  90% distal RCA stenosis.  99% subtotal occlusion of the ostium of left circumflex artery.  80% stenosis of proximal and mid LAD.  Patent LIMA to LAD graft.  Patent sequential SVG to diagonal marginal graft.  Patent SVG to right PDA graft.  Closure of the right common femoral artery access site with Mynx control closure device.     RECOMMENDATIONS:    Continue aggressive medical management of the coronary artery disease risk factor.  Consider evaluation of alternative etiologies of the patient's chest pain.  Cardiovascular Health Factors  Vitals BP Readings from Last 3 Encounters:   04/20/24 (!) 155/86   03/23/24 136/74   02/29/24 121/70     Wt Readings from Last 3 Encounters:   04/20/24 87.3 kg (192 lb 6.4 oz)   03/23/24 85.9 kg (189 lb 6.4 oz)   02/22/24 86.3 kg (190 lb 3.2 oz) BMI Readings from Last 3 Encounters:   04/20/24 30.13 kg/m?   03/23/24 29.66 kg/m?   02/22/24 29.79 kg/m?      Smoking Social History     Tobacco Use   Smoking Status Never   Smokeless Tobacco Never      Lipid Profile Cholesterol   Date Value Ref Range Status   01/16/2024 64 <200 mg/dL Final     HDL   Date Value Ref Range Status   01/16/2024 22 (L) >40 mg/dL Final     LDL   Date Value Ref Range Status   01/16/2024 37 <100 mg/dL Final     Triglycerides   Date Value Ref Range Status   01/16/2024 68 <150 mg/dL Final      Blood Sugar Hemoglobin A1C   Date Value Ref Range Status   01/16/2024 5.9 (H) 4.0 - 5.7 % Final     Comment:     The ADA recommends that most patients with type 1 and type 2 diabetes maintain an A1c level <7%.     Glucose   Date Value Ref Range Status   02/15/2024 109 (H) 70 - 105 Final   01/25/2024 94 70 - 105 Final   01/18/2024 94 70 - 100 mg/dL Final     Glucose, POC   Date Value Ref Range Status   12/02/2016 151 (H) 70 - 100 MG/DL Final   97/86/7981 876 (H) 70 - 100 MG/DL Final   97/86/7981 890 (H) 70 - 100 MG/DL Final          Problems Addressed Today  Coronary artery disease.  Hypertension.  Hypercholesterolemia.    Assessment and Plan   Mr. Watkinson currently appears stable from a cardiovascular perspective.  He is active and reports no angina or congestive symptoms.  His LDL cholesterol is  very well-controlled.  We checked his blood pressure cuff with a blood pressure cuff in clinic and they correlated very well.  His blood pressure appears very well-controlled at home, although his blood pressure is elevated in clinic.  Therefore, no changes in his medical regimen were made today.  Cardiovascular risk factor management was reviewed in detail.    Concerning elective orthopedic surgery: Based upon the results of the patient's clinical profile along with non-invasive and invasive cardiovascular testing, the risk for cardiovascular morbidity and mortality associated with non-cardiac surgery is ELEVATED. This by no means precludes surgery but provides information for the surgeon, anesthesiologist and patient concerning the risks of surgery.  A decision to proceed with surgery should be made based upon the relative benefits and risks involved. Indeed, orthopedic surgery could be a very helpful intervention for this patient's overall health maintenance and could prove very helpful for improvement in his exercise tolerance, functional capacity and cardiovascular stability. There are no absolute contra-indications to elective orthopedic surgery, as deemed necessary. No additional cardiovascular testing is required at this time.  The patient will have to accept the risk of holding his aspirin  if required by his surgical team during the perioperative period.  If his aspirin  is held in the perioperative period, then my recommendation would be to hold his aspirin  for as short of a period as possible, restarting his aspirin  as soon as his bleeding risk is not prohibitive.  Mr. Grundman told me that he was willing to hold his aspirin  for short periods of time if required by his surgical team.  I have asked him to return for follow-up in 6 months time. The total time spent during this interview and exam with preparation and chart review was 30 minutes.         Current Medications (including today's revisions)   allopurinol  (ZYLOPRIM ) 300 mg tablet Take one tablet by mouth daily. Take with food.    amLODIPine  (NORVASC ) 2.5 mg tablet Take one tablet by mouth daily.    aspirin  EC 81 mg tablet Take one tablet by mouth at bedtime daily. Take with food.    celecoxib (CELEBREX) 100 mg capsule Take one capsule by mouth daily.    gabapentin  (NEURONTIN ) 300 mg capsule Take one capsule by mouth daily.    levothyroxine  (SYNTHROID ) 50 mcg tablet Take one tablet by mouth daily 30 minutes before breakfast.    losartan  (COZAAR ) 50 mg tablet Take one tablet by mouth daily.    naproxen sodium (ALEVE) 220 mg tablet Take one tablet by mouth as Needed.    nitroglycerin  (NITROSTAT ) 0.4 mg tablet Place one tablet under tongue every 5 minutes as needed for Chest Pain. Max of 3 tablets, call 911.    pantoprazole  DR (PROTONIX ) 40 mg tablet Take one tablet by mouth daily.    rosuvastatin  (CRESTOR ) 10 mg tablet Take one tablet by mouth daily.    tamsulosin  (FLOMAX ) 0.4 mg capsule Take one capsule by mouth at bedtime daily.    traZODone (DESYREL) 50 mg tablet Take one tablet by mouth at bedtime daily.

## 2024-04-20 NOTE — Patient Instructions
Thank you for visiting our office today.    We would like to make the following medication adjustments:  NONE       Otherwise continue the same medications as you have been doing.          We will be pursuing the following tests after your appointment today:       Orders Placed This Encounter    ECG 12-LEAD         We will plan to see you back in 6 months.  Please call us in the meantime with any questions or concerns.        Please allow 5-7 business days for our providers to review your results. All normal results will go to MyChart. If you do not have Mychart, it is strongly recommended to get this so you can easily view all your results. If you do not have mychart, we will attempt to call you once with normal lab and testing results. If we cannot reach you by phone with normal results, we will send you a letter.  If you have not heard the results of your testing after one week please give us a call.       Your Cardiovascular Medicine [place] Team (Steve, Lisa, Jamie, Melanie, and Gael Delude)  phone number is [phone].

## 2024-04-29 ENCOUNTER — Encounter: Admit: 2024-04-29 | Discharge: 2024-04-29 | Payer: MEDICARE

## 2024-10-24 ENCOUNTER — Encounter: Admit: 2024-10-24 | Discharge: 2024-10-24 | Payer: MEDICARE

## 2024-10-27 ENCOUNTER — Encounter: Admit: 2024-10-27 | Discharge: 2024-10-27 | Payer: MEDICARE

## 2024-11-01 ENCOUNTER — Encounter: Admit: 2024-11-01 | Discharge: 2024-11-01 | Payer: MEDICARE

## 2024-11-07 ENCOUNTER — Encounter: Admit: 2024-11-07 | Discharge: 2024-11-07 | Payer: MEDICARE

## 2024-11-07 VITALS — BP 145/75 | HR 69 | Ht 67.0 in | Wt 202.0 lb

## 2024-11-07 DIAGNOSIS — I5032 Chronic diastolic (congestive) heart failure: Secondary | ICD-10-CM

## 2024-11-07 DIAGNOSIS — M2550 Pain in unspecified joint: Secondary | ICD-10-CM

## 2024-11-07 DIAGNOSIS — I110 Hypertensive heart disease with heart failure: Principal | ICD-10-CM

## 2024-11-07 DIAGNOSIS — I10 Essential (primary) hypertension: Secondary | ICD-10-CM

## 2024-11-07 DIAGNOSIS — E782 Mixed hyperlipidemia: Secondary | ICD-10-CM

## 2024-11-07 NOTE — Patient Instructions [37]
 Thank you for visiting our office today.    We would like to make the following medication adjustments:  NONE       Otherwise continue the same medications as you have been doing.          We will be pursuing the following tests after your appointment today:       Orders Placed This Encounter    AMB REFERRAL TO RHEUMATOLOGY         We will plan to see you back in 6 months.  Please call us  in the meantime with any questions or concerns.        Please allow 5-7 business days for our providers to review your results. All normal results will go to MyChart. If you do not have Mychart, it is strongly recommended to get this so you can easily view all your results. If you do not have mychart, we will attempt to call you once with normal lab and testing results. If we cannot reach you by phone with normal results, we will send you a letter.  If you have not heard the results of your testing after one week please give us  a call.       Your Cardiovascular [facility] [place] Team [place], [place]lam [place], Andrea, and [place])  phone number is [phone].

## 2024-11-07 NOTE — Progress Notes [1]
 Date of Service: 11/07/2024    Billy May is a 74 y.o. male.       HPI   Mr. Billy May is followed for coronary artery disease, hypertension and hypercholesterolemia.  When I saw him on February 11, 2024 Mr. Billy May reported exertional chest discomfort that would come on when carrying heavy bags of animal feed or rushing in a department store.  Mr. Billy May did not tolerate beta-blocker therapy because of fatigue and lethargy and did not tolerate long-acting nitrates because of headaches.  He was referred for coronary angiography which was performed on 02/21/2024.  This revealed patent bypass grafts (see below).  No coronary intervention was required.  However, Mr. Billy May reports no chest discomfort whatsoever since undergoing cholecystectomy in May 2025.  He has been monitoring his blood pressure at home and his blood pressure has been very well-controlled.  Currently he is taking amlodipine  5 mg daily.  At times, he reduces the dose to 2.5 mg to avoid orthostasis.  Perhaps this is when he is under less stress or has lower salt intake.  Mr. Billy May reports that he underwent cholecystectomy on Feb 24, 2024 without complication.  Since he underwent cholecystectomy he has had no chest discomfort or abdominal discomfort whatsoever.  Mr. Billy May reports that he underwent left total knee arthroplasty on May 07, 2024 without complications and is much improved..  Otherwise, the patient reports no angina, congestive symptoms, palpitations, sensation of sustained forceful heart pounding, falls, presyncope or syncope.  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 He does have an elliptical at home that he does not use.  The patient reports no myalgias, claudication, bleeding abnormalities, or strokelike symptoms.    Historically, on November 27, 2016 he underwent coronary artery bypass times 4 (left internal mammary artery anastomosed to the LAD, reverse saphenous vein graft anastomosed in sequence to the high diagonal and circumflex marginal artery and reverse saphenous vein graft anastomosed to the posterior descending artery).  He had Covid in December 2020.  He describes this as a mild case with cough and low-grade temperature and some muscle aches.  He was seen by a physician who performed an x-ray and said that he had bronchitis without pneumonia.  He was treated with prednisone. He reports that he underwent back surgery on 04/15/2023 with laminectomy to L2-L5 without complications. On January 15, 2024 he was hospitalized for abdominal discomfort which was attributed to symptomatic cholelithiasis, possible recent choledocholithiasis.  It was thought that he most likely passed a gallstone.     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 Freeman  ANGIOGRAPHY:    The left main arises normally from the left coronary cusp and bifurcates into the LAD and the left circumflex artery.  The left main has about a 30% stenosis in its distal segment.    The LAD arises normally from the left main coronary artery.  The LAD is a type 3 vessel.  The LAD has diffuse 40% to 50% disease in its proximal, as well as in its mid section, and it is heavily calcified in the same area.  The LAD also has an 80% stenosis in its mid segment.  Distal to this, the LAD has about 20% to 30% stenosis in the mid section after the second diagonal branch.  The LAD gives off 3 diagonal branches.  The first of the 3 diagonal branches has about 50% to 60% stenosis in its proximal segment.     The circumflex artery arises normally from the left main coronary artery.  The left circumflex artery has 4-5 tandem lesions in its proximal to mid section, which are all varying from 70% to 90% stenosis.  It gives off an OM branch that does not appear to have any angiographic evidence of significant stenosis.  The RCA is a dominant vessel that arises normally from the right coronary cusp.  The RCA has about 50% stenosis in its ostial segment.  The mid to distal RCA is heavily calcified with diffuse disease of about 30% to 40%.  The PDA branch appears to have about 90% stenosis in its to distal segment.  The PLV branch is a small branch that does not appear to have any angiographic evidence of stenosis.  CONCLUSION:  Multivessel coronary artery disease as described above.      Abnormal thallium stress test 11/05/2016    Essential hypertension 09/22/2016    Family history of coronary artery disease 09/22/2016    Mixed dyslipidemia 09/08/2016    Observation for suspected cardiovascular disease 09/08/2016     01/28/2014  Exercise Stress exam   Conclusion:  No exercises induced chest pain, no ECG evidence of ischemia. PVC seen.  Hypertensive response to exercise  Medical thearpy with lifestyle modification.  Low risk study.      Chest pain 09/08/2016       Review of Systems   Constitutional: Negative.   HENT: Negative.     Eyes: Negative.    Cardiovascular: Negative.    Respiratory: Negative.     Endocrine: Negative.    Hematologic/Lymphatic: Negative.    Skin: Negative.    Musculoskeletal: Negative.    Gastrointestinal: Negative.    Genitourinary: Negative.    Neurological: Negative.    Psychiatric/Behavioral: Negative.     Allergic/Immunologic: Negative.        Physical Exam  GENERAL: The patient is well developed, well nourished, resting comfortably and in no distress.   HEENT: No abnormalities of the visible oro-nasopharynx, conjunctiva or sclera are noted.  NECK: There is no jugular venous distension. Carotids are palpable and without bruits. There is no thyroid enlargement.  Chest: Lung fields are clear to auscultation. There are no wheezes or crackles.   CV: There  is a regular rhythm. The first and second heart sounds are normal.  A soft systolic ejection murmur is heard.  There are no diastolic murmurs, gallops or rubs.  His apical heart rate is 64 bpm.  ABD: The abdomen is soft and supple with normal bowel sounds. There is no hepatosplenomegaly, ascites, tenderness, masses or bruits.  Neuro: There are no focal motor defects. Ambulation is normal. Cognitive function appears normal.  Ext: There is no edema or evidence of deep vein thrombosis. Peripheral pulses are satisfactory.  His right femoral cath site from yesterday looks good without hematoma or bleeding.  SKIN: There are no rashes and no cellulitis.    PSYCH: The patient is calm, rationale and oriented.    Cardiovascular Studies  A twelve-lead ECG obtained on 04/20/2024 reveals mild sinus bradycardia with a heart rate of 59 bpm.  Incomplete right bundle branch block is noted with QRS = 96 ms.  There is no evidence of myocardial ischemia or infarction.  Echo Doppler 01/15/2024:  Interpretation Summary  Technically difficult study due to suboptimal endocardial resolution.     Normal left ventricular size and low normal systolic function.  LVEF of 50-55.  Abnormal septal motion due to LBBB and postoperative state; otherwise, no regional wall motion abnormalities.  Grade I LV diastolic dysfunction.  Normal right ventricular size and borderline systolic function.  Mild right atrial dilatation.  Normal-sized left atrium.  Mild aortic sclerotic/calcified aortic valve without stenosis.  Mild aortic regurgitation.  Estimated CVP 0-5 mmHg.  Estimated PASP of 30 mmHg.  No pericardial effusion.  Compared to prior study on 05/17/2023, biventricular systolic function appears less dynamic, but should note that Definity  contrast was not utilized on the prior study and this may account for some of the difference in visualized contractility.  Estimated PASP is lower (previously 47 mmHg).     Coronary angiography 02/21/2024:  SELECTIVE CORONARY ANGIOGRAPHY:    Left main coronary artery:  The left main coronary arises from the left coronary sinus, it has a mild luminal irregularity in the distal segment.  It bifurcates into left anterior descending and left circumflex artery.  Left anterior descending artery:  The left anterior descending artery is the medium caliber vessel, which is a type 2 configuration.  It has 80% stenosis in the proximal segment and the mid segment.  It gives rise to 2 diagonal branches.  The 1st diagonal branch is a medium caliber vessel.  It has a 99% subtotal occlusion at ostium.  Competitive filling is seen in the LAD suggestive of patent LIMA graft and in the diagonal artery, suggestive of patent SVG graft.  Left circumflex artery:  The left circumflex artery is a 99% subtotal occlusion at the ostium, it has severe diffuse disease from the proximal segment extending in to the obtuse marginal branch.  Right coronary artery:  The right coronary artery is a large dominant vessel, arises from the right coronary sinus, it bifurcates into right posterior descending artery and divides posterolateral ventricular branch.  The ostium of RCA has a 50-60% stenosis. RCA has a diffuse mild to moderate diffuse disease with a multiple lumpy bumpy disease throughout the course.  Distal RCA before bifurcation has a 90% stenosis.  PDA and PLV are visualized on the venous graft angiogram.     SELECTIVE BYPASS GRAFT ANGIOGRAPHY:    LIMA to LAD:  The LIMA to LAD graft was visualized using IMA diagnostic catheter.  The ostium, body and anastomotic site of the LIMA graft is  angiographically normal.  LIMA supplies medium caliber LAD artery.  Post anastomotic site, the LAD is angiographically normal.  Sequential SVG to diagonal marginal graft:  It was engaged using JR4 diagnostic catheter.  The ostium, body and anastomotic site of the graft is angiographically normal.  It supplies the medium caliber diagonal branch and the small caliber marginal branch.  Post anastomotic site, the diagonal and the marginal branch are angiographically normal.  Graft retrogradely fills the diagonal branch and goes all the way up to the ostium but does not fill the LAD.  The venous graft fills the marginal branch retrogradely up to the mid segment where it is totally occluded..  SVG to right PDA graft:  It was engaged using multipurpose catheter.  The ostium, body and anastomotic sites of the graft was angiographically normal.  The post anastomosis PDA artery is angiographically normal.  The graft retrogradely fills PDA and antegradely fills the PLV branches.  They are angiographically normal.     FINAL IMPRESSION:    Multivessel obstructive coronary artery disease involving:  90% distal RCA stenosis.  99% subtotal occlusion of the ostium of left circumflex artery.  80% stenosis of proximal and mid LAD.  Patent LIMA to LAD graft.  Patent sequential SVG to diagonal marginal graft.  Patent SVG to right PDA graft.  Closure of the right common femoral artery access site with Mynx control closure device.     RECOMMENDATIONS:    Continue aggressive medical management of the coronary artery disease risk factor.  Consider evaluation of alternative etiologies of the patient's chest pain.  Cardiovascular Health Factors  Vitals BP Readings from Last 3 Encounters:   11/07/24 (!) 145/75   04/20/24 (!) 155/86   03/23/24 136/74     Wt Readings from Last 3 Encounters:   11/07/24 91.6 kg (202 lb)   04/20/24 87.3 kg (192 lb 6.4 oz)   03/23/24 85.9 kg (189 lb 6.4 oz)     BMI Readings from Last 3 Encounters:   11/07/24 31.64 kg/m?   04/20/24 30.13 kg/m?   03/23/24 29.66 kg/m?      Smoking Tobacco Use History[1]   Lipid Profile Cholesterol   Date Value Ref Range Status   06/08/2024 102  Final     HDL   Date Value Ref Range Status   06/08/2024 21 (L) >=40 Final     LDL   Date Value Ref Range Status   06/08/2024 56  Final     Triglycerides   Date Value Ref Range Status   06/08/2024 126  Final      Blood Sugar Hemoglobin A1C   Date Value Ref Range Status   01/16/2024 5.9 (H) 4.0 - 5.7 % Final     Comment:     The ADA recommends that most patients with type 1 and type 2 diabetes maintain an A1c level <7%.     Glucose   Date Value Ref Range Status   06/08/2024 96  Final   02/15/2024 109 (H) 70 - 105 Final   01/25/2024 94 70 - 105 Final     Glucose, POC   Date Value Ref Range Status   12/02/2016 151 (H) 70 - 100 MG/DL Final   97/86/7981 876 (H) 70 - 100 MG/DL Final   97/86/7981 890 (H) 70 - 100 MG/DL Final         Problems Addressed Today  Coronary artery disease.  Hypertension.  Hypercholesterolemia.    Assessment and Plan     Mr.  Ost currently appears stable from a cardiovascular perspective.  He is active and reports no angina or congestive symptoms.  His LDL cholesterol is very well-controlled.  He reports that his blood pressure is well controlled when checked at home.  He brought in a logbook of his blood pressure readings.  His blood pressure at home was 114 over 54 mmHg with a heart rate of 70 bpm earlier today.  His blood pressure was 107 over 63 mmHg on 11/07/2023 with a heart rate of 67 bpm.  Therefore, no changes in his medical regimen were made today.  Cardiovascular risk factor management was reviewed in detail.  I have asked him to return for follow-up in approximately 6 months. The total time spent during this interview and exam with preparation and chart review was 30 minutes.         Current Medications (including today's revisions)   allopurinol  (ZYLOPRIM ) 300 mg tablet Take one tablet by mouth daily. Take with food.    amLODIPine  (NORVASC ) 2.5 mg tablet Take one tablet by mouth daily. (Patient taking differently: Take two tablets by mouth daily.)    aspirin  EC 81 mg tablet Take one tablet by mouth at bedtime daily. Take with food.    celecoxib (CELEBREX) 100 mg capsule Take one capsule by mouth daily.    gabapentin  (NEURONTIN ) 300 mg capsule Take one capsule by mouth daily.    levothyroxine  (SYNTHROID ) 50 mcg tablet Take one tablet by mouth daily 30 minutes before breakfast.    losartan  (COZAAR ) 50 mg tablet Take one tablet by mouth daily.    naproxen sodium (ALEVE) 220 mg tablet Take one tablet by mouth as Needed.    nitroglycerin  (NITROSTAT ) 0.4 mg tablet Place one tablet under tongue every 5 minutes as needed for Chest Pain. Max of 3 tablets, call 911.    pantoprazole  DR (PROTONIX ) 40 mg tablet Take one tablet by mouth daily.    rosuvastatin  (CRESTOR ) 10 mg tablet TAKE 1 TABLET BY MOUTH EVERY DAY    tamsulosin  (FLOMAX ) 0.4 mg capsule Take one capsule by mouth at bedtime daily.    traZODone (DESYREL) 50 mg tablet Take one tablet by mouth at bedtime daily.                 [1]   Social History  Tobacco Use   Smoking Status Never   Smokeless Tobacco Never
